# Patient Record
Sex: Female | Born: 1990 | Hispanic: No | Marital: Single | Smoking: Never smoker
Health system: Southern US, Community
[De-identification: ages and names within clinical notes are randomized; demographics above are authoritative.]

## PROBLEM LIST (undated history)

## (undated) DIAGNOSIS — E8801 Alpha-1-antitrypsin deficiency: Secondary | ICD-10-CM

## (undated) HISTORY — PX: WISDOM TOOTH EXTRACTION: SHX21

---

## 2017-07-09 NOTE — L&D Delivery Note (Signed)
Delivery Note At 3:25 AM a viable and healthy female was delivered via Vaginal, Spontaneous (Presentation:vtx ; ROA ).  APGAR: 3, 5; 7 weight 1 lb 4.8 oz (590 g).   Placenta status: spontaneous, abnormal/clot adherent  To path , .  Cord: thin caliber avulsed with the following complications: abruption.  Cord pH: n/a  Anesthesia:  none Episiotomy: None Lacerations: None Suture Repair: n/a Est. Blood Loss (mL): 54  Mom to postpartum.  Baby to NICU.  Raejean Swinford A Raijon Lindfors 06/17/2018, 4:06 AM

## 2018-03-05 LAB — OB RESULTS CONSOLE ABO/RH

## 2018-03-06 LAB — OB RESULTS CONSOLE ABO/RH: RH Type: POSITIVE

## 2018-03-11 LAB — OB RESULTS CONSOLE ANTIBODY SCREEN: ANTIBODY SCREEN: NEGATIVE

## 2018-03-11 LAB — OB RESULTS CONSOLE HEPATITIS B SURFACE ANTIGEN: Hepatitis B Surface Ag: NEGATIVE

## 2018-03-11 LAB — OB RESULTS CONSOLE RUBELLA ANTIBODY, IGM: Rubella: IMMUNE

## 2018-03-11 LAB — OB RESULTS CONSOLE RPR: RPR: NONREACTIVE

## 2018-03-11 LAB — OB RESULTS CONSOLE HIV ANTIBODY (ROUTINE TESTING): HIV: NONREACTIVE

## 2018-03-21 LAB — OB RESULTS CONSOLE GC/CHLAMYDIA
Chlamydia: NEGATIVE
Gonorrhea: NEGATIVE

## 2018-06-13 ENCOUNTER — Other Ambulatory Visit: Payer: Self-pay

## 2018-06-13 ENCOUNTER — Inpatient Hospital Stay (HOSPITAL_BASED_OUTPATIENT_CLINIC_OR_DEPARTMENT_OTHER): Payer: BLUE CROSS/BLUE SHIELD

## 2018-06-13 ENCOUNTER — Inpatient Hospital Stay (HOSPITAL_COMMUNITY)
Admission: AD | Admit: 2018-06-13 | Discharge: 2018-06-18 | DRG: 805 | Disposition: A | Discharge status: Home / Self Care | Payer: BLUE CROSS/BLUE SHIELD | Attending: Obstetrics and Gynecology | Admitting: Obstetrics and Gynecology

## 2018-06-13 ENCOUNTER — Encounter (HOSPITAL_COMMUNITY): Payer: Self-pay | Admitting: *Deleted

## 2018-06-13 DIAGNOSIS — O468X2 Other antepartum hemorrhage, second trimester: Secondary | ICD-10-CM | POA: Diagnosis not present

## 2018-06-13 DIAGNOSIS — Z3A22 22 weeks gestation of pregnancy: Secondary | ICD-10-CM

## 2018-06-13 DIAGNOSIS — O9081 Anemia of the puerperium: Secondary | ICD-10-CM | POA: Diagnosis not present

## 2018-06-13 DIAGNOSIS — O322XX0 Maternal care for transverse and oblique lie, not applicable or unspecified: Secondary | ICD-10-CM | POA: Diagnosis present

## 2018-06-13 DIAGNOSIS — E8801 Alpha-1-antitrypsin deficiency: Secondary | ICD-10-CM | POA: Diagnosis present

## 2018-06-13 DIAGNOSIS — O99284 Endocrine, nutritional and metabolic diseases complicating childbirth: Secondary | ICD-10-CM | POA: Diagnosis present

## 2018-06-13 DIAGNOSIS — D62 Acute posthemorrhagic anemia: Secondary | ICD-10-CM | POA: Diagnosis not present

## 2018-06-13 DIAGNOSIS — O4592 Premature separation of placenta, unspecified, second trimester: Secondary | ICD-10-CM | POA: Diagnosis present

## 2018-06-13 DIAGNOSIS — Z3A23 23 weeks gestation of pregnancy: Secondary | ICD-10-CM | POA: Diagnosis not present

## 2018-06-13 DIAGNOSIS — O4692 Antepartum hemorrhage, unspecified, second trimester: Secondary | ICD-10-CM

## 2018-06-13 DIAGNOSIS — B9689 Other specified bacterial agents as the cause of diseases classified elsewhere: Secondary | ICD-10-CM | POA: Diagnosis present

## 2018-06-13 DIAGNOSIS — O4690 Antepartum hemorrhage, unspecified, unspecified trimester: Secondary | ICD-10-CM

## 2018-06-13 DIAGNOSIS — O42912 Preterm premature rupture of membranes, unspecified as to length of time between rupture and onset of labor, second trimester: Secondary | ICD-10-CM | POA: Diagnosis present

## 2018-06-13 DIAGNOSIS — O26872 Cervical shortening, second trimester: Secondary | ICD-10-CM | POA: Diagnosis present

## 2018-06-13 HISTORY — DX: Alpha-1-antitrypsin deficiency: E88.01

## 2018-06-13 LAB — URINALYSIS, ROUTINE W REFLEX MICROSCOPIC
BILIRUBIN URINE: NEGATIVE
Bacteria, UA: NONE SEEN
Glucose, UA: NEGATIVE mg/dL
Ketones, ur: NEGATIVE mg/dL
Nitrite: NEGATIVE
Protein, ur: NEGATIVE mg/dL
Specific Gravity, Urine: 1.004 — ABNORMAL LOW (ref 1.005–1.030)
pH: 7 (ref 5.0–8.0)

## 2018-06-13 LAB — TYPE AND SCREEN
ABO/RH(D): O POS
Antibody Screen: NEGATIVE

## 2018-06-13 LAB — CBC
HCT: 33.8 % — ABNORMAL LOW (ref 36.0–46.0)
Hemoglobin: 10.8 g/dL — ABNORMAL LOW (ref 12.0–15.0)
MCH: 29.4 pg (ref 26.0–34.0)
MCHC: 32 g/dL (ref 30.0–36.0)
MCV: 92.1 fL (ref 80.0–100.0)
Platelets: 246 10*3/uL (ref 150–400)
RBC: 3.67 MIL/uL — AB (ref 3.87–5.11)
RDW: 13.1 % (ref 11.5–15.5)
WBC: 11.8 10*3/uL — ABNORMAL HIGH (ref 4.0–10.5)
nRBC: 0 % (ref 0.0–0.2)

## 2018-06-13 LAB — WET PREP, GENITAL
Sperm: NONE SEEN
Trich, Wet Prep: NONE SEEN
Yeast Wet Prep HPF POC: NONE SEEN

## 2018-06-13 MED ORDER — INDOMETHACIN 25 MG PO CAPS
25.0000 mg | ORAL_CAPSULE | Freq: Four times a day (QID) | ORAL | Status: AC
  Administered 2018-06-14 – 2018-06-15 (×8): 25 mg via ORAL
  Filled 2018-06-13 (×8): qty 1

## 2018-06-13 MED ORDER — LACTATED RINGERS IV BOLUS
500.0000 mL | Freq: Once | INTRAVENOUS | Status: AC
  Administered 2018-06-13: 500 mL via INTRAVENOUS

## 2018-06-13 MED ORDER — ZOLPIDEM TARTRATE 5 MG PO TABS: 5.0000 mg | ORAL_TABLET | Freq: Every evening | ORAL | Status: DC | PRN

## 2018-06-13 MED ORDER — PROGESTERONE MICRONIZED 200 MG PO CAPS
200.0000 mg | ORAL_CAPSULE | Freq: Every day | ORAL | Status: DC
  Administered 2018-06-14 – 2018-06-15 (×3): 200 mg via VAGINAL
  Filled 2018-06-13 (×4): qty 1

## 2018-06-13 MED ORDER — INDOMETHACIN 50 MG RE SUPP
50.0000 mg | Freq: Once | RECTAL | Status: AC
  Administered 2018-06-14: 50 mg via RECTAL
  Filled 2018-06-13: qty 1

## 2018-06-13 MED ORDER — NIFEDIPINE 10 MG PO CAPS
10.0000 mg | ORAL_CAPSULE | ORAL | Status: AC | PRN
  Administered 2018-06-13 (×3): 10 mg via ORAL
  Filled 2018-06-13 (×3): qty 1

## 2018-06-13 MED ORDER — LACTATED RINGERS IV SOLN
INTRAVENOUS | Status: DC
  Administered 2018-06-13 – 2018-06-14 (×2): via INTRAVENOUS

## 2018-06-13 MED ORDER — NIFEDIPINE ER 30 MG PO TB24
30.0000 mg | ORAL_TABLET | Freq: Two times a day (BID) | ORAL | Status: DC
  Administered 2018-06-14 – 2018-06-16 (×5): 30 mg via ORAL
  Filled 2018-06-13 (×7): qty 1

## 2018-06-13 MED ORDER — PRENATAL MULTIVITAMIN CH
1.0000 | ORAL_TABLET | Freq: Every day | ORAL | Status: DC
  Administered 2018-06-14 – 2018-06-16 (×3): 1 via ORAL
  Filled 2018-06-13 (×3): qty 1

## 2018-06-13 MED ORDER — BETAMETHASONE SOD PHOS & ACET 6 (3-3) MG/ML IJ SUSP
12.0000 mg | INTRAMUSCULAR | Status: AC
  Administered 2018-06-13 – 2018-06-14 (×2): 12 mg via INTRAMUSCULAR
  Filled 2018-06-13 (×2): qty 2

## 2018-06-13 MED ORDER — CALCIUM CARBONATE ANTACID 500 MG PO CHEW: 2.0000 | CHEWABLE_TABLET | ORAL | Status: DC | PRN

## 2018-06-13 MED ORDER — METRONIDAZOLE 500 MG PO TABS
500.0000 mg | ORAL_TABLET | Freq: Two times a day (BID) | ORAL | Status: DC
  Administered 2018-06-13 – 2018-06-16 (×7): 500 mg via ORAL
  Filled 2018-06-13 (×9): qty 1

## 2018-06-13 MED ORDER — DOCUSATE SODIUM 100 MG PO CAPS
100.0000 mg | ORAL_CAPSULE | Freq: Every day | ORAL | Status: DC
  Administered 2018-06-14 – 2018-06-16 (×3): 100 mg via ORAL
  Filled 2018-06-13 (×3): qty 1

## 2018-06-13 MED ORDER — ACETAMINOPHEN 325 MG PO TABS: 650.0000 mg | ORAL_TABLET | ORAL | Status: DC | PRN

## 2018-06-13 NOTE — Plan of Care (Signed)
  Problem: Education: Goal: Knowledge of disease or condition will improve Outcome: Progressing   

## 2018-06-13 NOTE — MAU Provider Note (Signed)
Obstetric Attending MAU Note  Chief Complaint:  Vaginal Bleeding   First Provider Initiated Contact with Patient 06/13/18 1803     HPI: Brenda Wood is a 27 y.o. G1P0 at [redacted]w[redacted]d who presents to maternity admissions reporting pink spotting which started last pm. Bleeding got worse today and became bright red. Baby is moving well. Some pain in lower abdomen that radiates to lower back. Feels like when her cycle Is coming on. She denies LOF. Lower abdominal pressure noted. Reports normal u/s in the office. 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 Pregnancy is uncomplicated at [facility]. Denies issues with placenta on u/s. Denies vaginal discharge. Denies recent sexual intercourse. .  Pregnancy Course: Receives care at [facility] primary  Patient Active Problem List   Diagnosis Date Noted  . Preterm labor in second trimester 06/13/2018    Past Medical History:  Diagnosis Date  . Alpha-1-antitrypsin deficiency (HCC)     OB History  Gravida Para Term Preterm AB Living  1            SAB TAB Ectopic Multiple Live Births               # Outcome Date GA Lbr Len/2nd Weight Sex Delivery Anes PTL Lv  1 Current             Past Surgical History:  Procedure Laterality Date  . WISDOM TOOTH EXTRACTION      Family History: No family history on file.  Social History: Social History   Tobacco Use  . Smoking status: Never Smoker  . Smokeless tobacco: Never Used  Substance Use Topics  . Alcohol use: Never    Frequency: Never  . Drug use: Never    Allergies: No Known Allergies  Medications Prior to Admission  Medication Sig Dispense Refill Last Dose  . brompheniramine-pseudoephedrine-DM 30-2-10 MG/5ML syrup Take by mouth 4 (four) times daily as needed.   06/13/2018 at Unknown time  . fluticasone (FLONASE) 50 MCG/ACT nasal spray Place 1 spray into both nostrils daily.   06/12/2018 at Unknown time  . cetirizine (ZYRTEC) 10 MG tablet Take 10 mg by mouth  daily.       ROS: Pertinent findings in history of present illness.  Physical Exam  Blood pressure 121/67, pulse 86, temperature 98.4 F (36.9 C), resp. rate 18, height 5\' 3"  (1.6 m), weight 67.1 kg. CONSTITUTIONAL: Well-developed, well-nourished female in no acute distress.  HENT:  Normocephalic, atraumatic, External right and left ear normal. Oropharynx is clear and moist EYES: Conjunctivae and EOM are normal. Pupils are equal, round, and reactive to light. No scleral icterus.  NECK: Normal range of motion, supple, no masses SKIN: Skin is warm and dry. No rash noted. Not diaphoretic. No erythema. No pallor. NEUROLGIC: Alert and oriented to person, place, and time. Normal reflexes, muscle tone coordination. No cranial nerve deficit noted. PSYCHIATRIC: Normal mood and affect. Normal behavior. Normal judgment and thought content. CARDIOVASCULAR: Normal heart rate noted, regular rhythm RESPIRATORY: Effort and breath sounds normal, no problems with respiration noted ABDOMEN: Soft, nontender, nondistended, gravid appropriate for gestational age MUSCULOSKELETAL: Normal range of motion. No edema and no tenderness. 2+ distal pulses.  SPECULUM EXAM: NEFG, blood in vault, pink to dark red, cervix clean Dilation: 1 Cervical Position: Posterior Exam by:: Dr. Shawnie Pons Thick   FHT:  Positive on Doppler Contractions: q 3-4 mins   Labs: Results for orders placed or performed during the hospital encounter of 06/13/18 (from the past 24  hour(s))  Wet prep, genital     Status: Abnormal   Collection Time: 06/13/18  6:30 PM  Result Value Ref Range   Yeast Wet Prep HPF POC NONE SEEN NONE SEEN   Trich, Wet Prep NONE SEEN NONE SEEN   Clue Cells Wet Prep HPF POC PRESENT (A) NONE SEEN   WBC, Wet Prep HPF POC MANY (A) NONE SEEN   Sperm NONE SEEN   CBC on admission     Status: Abnormal   Collection Time: 06/13/18  7:15 PM  Result Value Ref Range   WBC 11.8 (H) 4.0 - 10.5 K/uL   RBC 3.67 (L) 3.87 - 5.11  MIL/uL   Hemoglobin 10.8 (L) 12.0 - 15.0 g/dL   HCT 60.4 (L) 54.0 - 98.1 %   MCV 92.1 80.0 - 100.0 fL   MCH 29.4 26.0 - 34.0 pg   MCHC 32.0 30.0 - 36.0 g/dL   RDW 19.1 47.8 - 29.5 %   Platelets 246 150 - 400 K/uL   nRBC 0.0 0.0 - 0.2 %  Urinalysis, Routine w reflex microscopic     Status: Abnormal   Collection Time: 06/13/18  8:05 PM  Result Value Ref Range   Color, Urine COLORLESS (A) YELLOW   APPearance CLEAR CLEAR   Specific Gravity, Urine 1.004 (L) 1.005 - 1.030   pH 7.0 5.0 - 8.0   Glucose, UA NEGATIVE NEGATIVE mg/dL   Hgb urine dipstick LARGE (A) NEGATIVE   Bilirubin Urine NEGATIVE NEGATIVE   Ketones, ur NEGATIVE NEGATIVE mg/dL   Protein, ur NEGATIVE NEGATIVE mg/dL   Nitrite NEGATIVE NEGATIVE   Leukocytes, UA TRACE (A) NEGATIVE   RBC / HPF 0-5 0 - 5 RBC/hpf   WBC, UA 0-5 0 - 5 WBC/hpf   Bacteria, UA NONE SEEN NONE SEEN   Squamous Epithelial / LPF 0-5 0 - 5    Imaging:  No results found.  MAU Course: I L IVF given Procardia x 3 given Sonogram, shows vtx fetus, nml fluid, no placenta previa, no sign of abruption, shortened cervix with fundal pressure 1.7 cm  Re-exam after 3 hours, shows cervix not worse and more posterior. Contractions have stopped. Still with dark red blood on examiner's finger with check.  Assessment: 1. Preterm labor in second trimester without delivery   2. Vaginal bleeding during pregnancy, antepartum   3.      Bacterial Vaginosis  Plan: Admit BMZ x 2 GBS culture NICU consult IVF Procardia XL Prometium Flagyl Consider Magnesium for CP ppx if progression of labor Discussed with Dr. Lorrin Goodell, [name], MD 06/13/2018 9:20 PM

## 2018-06-13 NOTE — MAU Note (Signed)
Pt reports she has some spotting yesterday light pink. Bleeding got darker and  Heavier a few hours ago. Reports some mild lower abd pain and back pain

## 2018-06-14 LAB — ABO/RH: ABO/RH(D): O POS

## 2018-06-14 NOTE — Progress Notes (Signed)
S: slept  Through night Denies ctx (+) FM Notes scant dark red blood  O: Blood pressure 125/80, pulse 72, temperature 98.5 F (36.9 C), temperature source Oral, resp. rate 16, height 5\' 3"  (1.6 m), weight 67.1 kg, SpO2 99 %. Lungs clear to A Cor RRR Abd gravid soft non tender Pelvic deferred Ext no edema Pad small spot of dark red( burgundy )  Tracing: (+) FHR  ctx resolved after 2nd dose of indocin  IMP: unexplained 2nd trim vaginal bleeding PMC on indocin, procardia Cervical shortening in 2nd trimester on prometrium IUP@ 23 wk. BMz  P) cont with indocin for 48 hrs. Remain inpt. Cont with procardia. FHR  q shift. Cont with toco as pt does not Perceive ctx

## 2018-06-14 NOTE — H&P (Signed)
Brenda Wood is a 27 y.o. female presenting with vaginal bleeding noted this am. Pt denies intercourse. Uncomplicated pregnancy to date. See MAU notes by Dr Shawnie Pons. Pt was found to have cervical dilation with associated shortening of cervix. sono showed cervix 1.7 cm. (+) abdominal cramping. Pt was given IVF as well as procardia x 3 OB History    Gravida  1   Para      Term      Preterm      AB      Living        SAB      TAB      Ectopic      Multiple      Live Births             Past Medical History:  Diagnosis Date  . Alpha-1-antitrypsin deficiency [facility])    Past Surgical History:  Procedure Laterality Date  . WISDOM TOOTH EXTRACTION     Family History: family history is not on file. Social History:  reports that she has never smoked. She has never used smokeless tobacco. She reports that she does not drink alcohol or use drugs.     Maternal Diabetes: No Genetic Screening: Normal Maternal Ultrasounds/Referrals: Normal Fetal Ultrasounds or other Referrals:  None Maternal Substance Abuse:  No Significant Maternal Medications:  None Significant Maternal Lab Results:  None Other Comments:  None  Review of Systems  Gastrointestinal: Positive for abdominal pain.  Genitourinary: Negative for dysuria.   History Dilation: 1 Exam by:: Dr. Shawnie Pons Blood pressure 121/67, pulse 86, temperature 98.4 F (36.9 C), resp. rate 18, height 5\' 3"  (1.6 m), weight 67.1 kg. Exam Physical Exam  Constitutional: She is oriented to person, place, and time. She appears well-developed and well-nourished.  Eyes: EOM are normal.  Neck: Neck supple.  Cardiovascular: Regular rhythm.  Respiratory: Breath sounds normal.  GI: Soft.  Musculoskeletal: She exhibits no edema.  Neurological: She is alert and oriented to person, place, and time.  Skin: Skin is warm and dry.    Prenatal labs: ABO, Rh: --/--/O POS (12/06 1915) Antibody: NEG (12/06 1915) Rubella:  Immune2 RPR:    NR HBsAg:   neg HIV:   neg GBS:   not done  Assessment/Plan: 2nd trimester vaginal bleeding Cervical shortening in pregnancy Alpha 1 antitrypsin deficiency IUP @ 22 6/7 weeks BV P) admit. Will start indocin x 48 hrs. Prometrium. NICU consult cont toco. IVF. Flagyl for BV. FHR  monitoring  Belladonna Lubinski A Charlsie Fleeger 06/14/2018, 12:01 AM

## 2018-06-14 NOTE — Consult Note (Signed)
Neonatology Consult Note:  At the request of the patients obstetrician Dr. Ronita Hipps I met with Sande Rives and FOB.  She is 23 weeks currently with pregnancy complicated by 2nd trimester vaginal bleeding, cervical shortening, Alpha 1 antitrypsin deficiency.  She is receiving indocin, prometrium, IVF, Flagyl for BV and undergoing FHR  monitoring.  We discussed morbidity/mortality at this gestional age, delivery room resuscitation, including intubation and surfactant in DR.  Discussed mechanical ventilation and risk for chronic lung disease, risk for IVH with potential for motor / cognitive deficits, ROP, NEC, sepsis, as well as temperature instability and feeding immaturity.  Discussed NG / OG feeds, benefits of MBM in reducing incidence of NEC.   Discussed likely length of stay.  Thank you for allowing [place] to participate in her care.  Please call with questions.  Higinio Roger, DO  Neonatologist  The total length of face-to-face or floor / unit time for this encounter was 30 minutes.  Counseling and / or coordination of care was greater than fifty percent of the time.

## 2018-06-15 DIAGNOSIS — O4692 Antepartum hemorrhage, unspecified, second trimester: Secondary | ICD-10-CM | POA: Diagnosis present

## 2018-06-15 NOTE — Progress Notes (Signed)
S: notes scant blood with wiping  (+) FM Denies ctx  O: BP 111/68 (BP Location: Right Arm)   Pulse 83   Temp 98.6 F (37 C) (Oral)   Resp 18   Ht 5\' 3"  (1.6 m)   Wt 67.1 kg   SpO2 97%   BMI 26.22 kg/m  Lungs clear to A  Cor RRR Abd gravid nontender Pelvic sl flared ext os/firm short 2cm posterior Extr. No edema or calf tenderness  Tracing (+) FHR No ctx  IMP: PMC on procardia XL , indocin Cervical shortening in pregnancy IUP@ 23 1/7  Wk BMZ given 2nd trimester vaginal bleeding P) complete indocin. Watch sx off indocin. Disc future mgmt per primary OB Dr Billy Coast. If remains stable , outpt mgmt

## 2018-06-15 NOTE — Discharge Instructions (Signed)

## 2018-06-16 ENCOUNTER — Encounter (HOSPITAL_COMMUNITY): Payer: Self-pay

## 2018-06-16 ENCOUNTER — Inpatient Hospital Stay (HOSPITAL_BASED_OUTPATIENT_CLINIC_OR_DEPARTMENT_OTHER): Payer: BLUE CROSS/BLUE SHIELD

## 2018-06-16 DIAGNOSIS — O4692 Antepartum hemorrhage, unspecified, second trimester: Secondary | ICD-10-CM

## 2018-06-16 DIAGNOSIS — Z3A23 23 weeks gestation of pregnancy: Secondary | ICD-10-CM

## 2018-06-16 LAB — CULTURE, BETA STREP (GROUP B ONLY)

## 2018-06-16 LAB — CBC
HCT: 30.9 % — ABNORMAL LOW (ref 36.0–46.0)
Hemoglobin: 10 g/dL — ABNORMAL LOW (ref 12.0–15.0)
MCH: 29.9 pg (ref 26.0–34.0)
MCHC: 32.4 g/dL (ref 30.0–36.0)
MCV: 92.2 fL (ref 80.0–100.0)
Platelets: 208 10*3/uL (ref 150–400)
RBC: 3.35 MIL/uL — ABNORMAL LOW (ref 3.87–5.11)
RDW: 13.7 % (ref 11.5–15.5)
WBC: 12.4 10*3/uL — ABNORMAL HIGH (ref 4.0–10.5)
nRBC: 0.3 % — ABNORMAL HIGH (ref 0.0–0.2)

## 2018-06-16 LAB — GC/CHLAMYDIA PROBE AMP (~~LOC~~) NOT AT ARMC
Chlamydia: NEGATIVE
Neisseria Gonorrhea: NEGATIVE

## 2018-06-16 LAB — MAGNESIUM: Magnesium: 3.9 mg/dL — ABNORMAL HIGH (ref 1.7–2.4)

## 2018-06-16 MED ORDER — MAGNESIUM SULFATE BOLUS VIA INFUSION
4.0000 g | Freq: Once | INTRAVENOUS | Status: AC
  Administered 2018-06-16: 4 g via INTRAVENOUS
  Filled 2018-06-16: qty 500

## 2018-06-16 MED ORDER — OXYTOCIN 10 UNIT/ML IJ SOLN: 10.0000 [IU] | Freq: Once | INTRAMUSCULAR | Status: DC

## 2018-06-16 MED ORDER — OXYCODONE-ACETAMINOPHEN 5-325 MG PO TABS: 1.0000 | ORAL_TABLET | ORAL | Status: DC | PRN

## 2018-06-16 MED ORDER — SODIUM CHLORIDE 0.9 % IV SOLN
2.0000 g | Freq: Four times a day (QID) | INTRAVENOUS | Status: DC
  Administered 2018-06-16 – 2018-06-17 (×2): 2 g via INTRAVENOUS
  Filled 2018-06-16: qty 2
  Filled 2018-06-16 (×2): qty 2000
  Filled 2018-06-16: qty 2

## 2018-06-16 MED ORDER — MAGNESIUM SULFATE 40 G IN LACTATED RINGERS - SIMPLE
2.0000 g/h | INTRAVENOUS | Status: DC
  Filled 2018-06-16: qty 500

## 2018-06-16 MED ORDER — LIDOCAINE HCL (PF) 1 % IJ SOLN
30.0000 mL | INTRAMUSCULAR | Status: DC | PRN
  Filled 2018-06-16: qty 30

## 2018-06-16 MED ORDER — LACTATED RINGERS IV SOLN
INTRAVENOUS | Status: DC
  Administered 2018-06-16: 18:00:00 via INTRAVENOUS

## 2018-06-16 MED ORDER — FLUTICASONE PROPIONATE 50 MCG/ACT NA SUSP
2.0000 | Freq: Every day | NASAL | Status: DC
  Administered 2018-06-16: 2 via NASAL
  Filled 2018-06-16: qty 16

## 2018-06-16 MED ORDER — SOD CITRATE-CITRIC ACID 500-334 MG/5ML PO SOLN: 30.0000 mL | ORAL | Status: DC | PRN

## 2018-06-16 MED ORDER — LACTATED RINGERS IV SOLN: INTRAVENOUS | Status: DC

## 2018-06-16 MED ORDER — OXYTOCIN BOLUS FROM INFUSION
500.0000 mL | Freq: Once | INTRAVENOUS | Status: AC
  Administered 2018-06-17: 500 mL via INTRAVENOUS

## 2018-06-16 MED ORDER — SODIUM CHLORIDE 0.9 % IV SOLN
500.0000 mg | INTRAVENOUS | Status: DC
  Administered 2018-06-16: 500 mg via INTRAVENOUS
  Filled 2018-06-16: qty 500

## 2018-06-16 MED ORDER — MAGNESIUM SULFATE 2 GM/50ML IV SOLN: 2.0000 g | Freq: Once | INTRAVENOUS | Status: DC

## 2018-06-16 MED ORDER — ACETAMINOPHEN 325 MG PO TABS: 650.0000 mg | ORAL_TABLET | ORAL | Status: DC | PRN

## 2018-06-16 MED ORDER — MAGNESIUM SULFATE BOLUS VIA INFUSION
2.0000 g | Freq: Once | INTRAVENOUS | Status: AC
  Administered 2018-06-16: 2 g via INTRAVENOUS
  Filled 2018-06-16: qty 500

## 2018-06-16 MED ORDER — TERBUTALINE SULFATE 1 MG/ML IJ SOLN
0.2500 mg | Freq: Once | INTRAMUSCULAR | Status: AC
  Administered 2018-06-16: 0.25 mg via SUBCUTANEOUS
  Filled 2018-06-16: qty 1

## 2018-06-16 MED ORDER — ONDANSETRON HCL 4 MG/2ML IJ SOLN: 4.0000 mg | Freq: Four times a day (QID) | INTRAMUSCULAR | Status: DC | PRN

## 2018-06-16 MED ORDER — OXYCODONE-ACETAMINOPHEN 5-325 MG PO TABS: 2.0000 | ORAL_TABLET | ORAL | Status: DC | PRN

## 2018-06-16 MED ORDER — OXYTOCIN 40 UNITS IN LACTATED RINGERS INFUSION - SIMPLE MED
2.5000 [IU]/h | INTRAVENOUS | Status: DC
  Filled 2018-06-16: qty 1000

## 2018-06-16 MED ORDER — LACTATED RINGERS IV SOLN: 500.0000 mL | INTRAVENOUS | Status: DC | PRN

## 2018-06-16 NOTE — Consult Note (Signed)
[facility]  [facility] 06/16/2018    7:30 PM  Neonatal Medicine Consultation         Mariama Saintvil          MRN:  [id number]  I was called at the request of the patient's obstetrician (Dr. Cherly Hensen) to speak to this patient due to potential premature delivery as early as 23 2/7 weeks.  She was seen on 12/7 by Dr. Algernon Huxley from neonatal medicine, but tonight has had her cervix change to complete dilatation.  Consequently she was moved from the 3rd floor Women's unit to L&D.  She has been started on magnesium sulfate and antibiotics.  Her OB plans to keep her under close observation, and delay the delivery as long as possible.  Refer to Dr. Mauricio Po note, as he reviewed all the features expected with a baby born this young.  I reviewed with the patient the expected survival and severe to profound CNS disease for 23-24 week newborns (as Dr. Algernon Huxley also did).  Given the poor survival and high morbidity, I advised the parents they are not obligated to request critical newborn care, and that sometimes parents elect to spend the time after birth just holding their newborn without medical intervention.  Of if they want [place] to provide all the support available to prolong their baby's life, we are prepared to do so.  However, if we do the latter and the newborn later shows evidence that our treatment has become futile and that the baby is dying, we would recommend the critical care be discontinued, not to prolong the baby's suffering.  For now, I reminded them that I am prepared to do everything possible to try and preserve this baby's life.  I spent 30 minutes reviewing the record, speaking to the patient, and entering appropriate documentation.  More than 50% of the time was spent face to face with patient.   _____________________ Electronically Signed By: Angelita Ingles, MD Neonatologist

## 2018-06-16 NOTE — Progress Notes (Signed)
Patient transferred to [facility] suites rm [id number] escorted by RN.

## 2018-06-16 NOTE — Progress Notes (Addendum)
HD#4 23.2 weeks   S: notes scant blood with wiping, slight dark red, mostly brown. No contraxns  (+) FM  O: BP (!) 112/59 (BP Location: Right Arm)   Pulse 92   Temp 98.2 F (36.8 C) (Oral)   Resp 18   Ht 5\' 3"  (1.6 m)   Wt 67.1 kg   SpO2 98%   BMI 26.22 kg/m  Abd gravid non tender, soft  Pelvic deferred repeat exam Extr. No edema or calf tenderness  Tracing (+) FHR 140s  No ctx  IMP: 27 yo G1 23.2 wks, threatened preterm labor with irritability, short cervix.  S/p BTMZ 12/6, 12/7 S/p Indocin 48 hrs, stopped last evening. No contractions and no new bleeding.  Continue Procardia PO, vaginal Prometrium  BV- Flagyl bid  Repeat CL today and if stable, discharge home and f/up with Dr Billy Coast in 1 week with repeat CL in office.  Pelvic rest, Modified bedrest, OOQ until further discussion, if cervix stability noted. Inform work.   Hilary Hertz, MD

## 2018-06-16 NOTE — Progress Notes (Signed)
Pt arrived at L&D SROM sl mec fluid per RN Digital exam revealed 4/60/-3 Tracing: baseline 140  (+) ctx q 4-5 mins  IMP": PPROM after prob hourglass cervical exam BV IUP@ 23 2/7 weeks P) start PPROM ( Amp/Azithromycin) antibiotics.  Magnesium Sulfate continue.

## 2018-06-16 NOTE — Progress Notes (Signed)
S:  Pt c/o nasal stuffiness Notes low back pain and some abdominal cramping  O: BP 120/73   Pulse 89   Temp 98.2 F (36.8 C) (Oral)   Resp 16   Ht 5\' 3"  (1.6 m)   Wt 67.1 kg   SpO2 98%   BMI 26.22 kg/m   VE deferred  Tracing: baseline 145-150 (+) variables with ctx Ctx q 2-5 mins CBC Latest Ref Rng & Units 06/16/2018 06/13/2018  WBC 4.0 - 10.5 K/uL 12.4(H) 11.8(H)  Hemoglobin 12.0 - 15.0 g/dL 10.0(L) 10.8(L)  Hematocrit 36.0 - 46.0 % 30.9(L) 33.8(L)  Platelets 150 - 400 K/uL 208 246   G/C neg GBS cx neg  [place] Mfm Ob Transvaginal  Result Date: 06/16/2018 ----------------------------------------------------------------------  OBSTETRICS REPORT                       (Signed Final 06/16/2018 06:46 pm) ---------------------------------------------------------------------- Patient Info  ID #:       [id number]                          D.O.B.:  March 09, 1991 (27 yrs)  Name:       Brenda Wood                   Visit Date: 06/16/2018 04:48 pm ---------------------------------------------------------------------- Performed By  Performed By:     Lenise Arena        Ref. Address:     [address]                                                             [place], [place]                                                             [address]  Attending:        Blase Mess MD       Secondary Phy.:   MAU Nursing-                                                             MAU/Triage  Referred By:      Reva Bores          Location:         [facility]  MD ---------------------------------------------------------------------- Orders   #  Description                          Code         Ordered By   1  [place] MFM OB TRANSVAGINAL               [phone]      VAISHALI MODY  ----------------------------------------------------------------------   #  Order #                    Accession #                  Episode #   1  [id number]                  [id number]                  [id number]  ---------------------------------------------------------------------- Indications   [redacted] weeks gestation of pregnancy                Z3A.23   Vaginal bleeding in pregnancy, second          O46.92   trimester  ---------------------------------------------------------------------- Vital Signs  Weight (lb): 148                               Height:        5'3"  BMI:         26.21 ---------------------------------------------------------------------- Fetal Evaluation  Num Of Fetuses:         1  Fetal Heart Rate(bpm):  151  Cardiac Activity:       Observed  Presentation:           Transverse, head to maternal left  Placenta:               Anterior  Amniotic Fluid  AFI FV:      Within normal limits                              Largest Pocket(cm)                              4.77 ---------------------------------------------------------------------- OB History  Gravidity:    1 ---------------------------------------------------------------------- Gestational Age  Clinical EDD:  23w 2d                                        EDD:   10/11/18  Best:          23w 2d     Det. By:  Clinical EDD             EDD:   10/11/18 ---------------------------------------------------------------------- Cervix Uterus Adnexa  Cervix  Length:              0  cm.  Funnel Width:        4  cm.  Appears dilated, see comments.  Comment  Non-measurable cervix and amniotic "sludge"  observed. ---------------------------------------------------------------------- Impression  Live mid-trimester gestation with cervical funeling and non-  measurable cervix. ---------------------------------------------------------------------- Recommendations  Continue admission until delivery due to elevated risk for  preterm delivery. ----------------------------------------------------------------------  Blase Mess, MD Electronically Signed Final Report   06/16/2018 06:46  pm ----------------------------------------------------------------------  [place] Mfm Ob Transvaginal  Result Date: 06/13/2018 ----------------------------------------------------------------------  OBSTETRICS REPORT                       (Signed Final 06/13/2018 10:51 pm) ---------------------------------------------------------------------- Patient Info  ID #:       [id number]                          D.O.B.:  [birth year] (27 yrs)  Name:       Brenda Wood                   Visit Date: 06/13/2018 07:41 pm ---------------------------------------------------------------------- Performed By  Performed By:     Ellin Saba        Ref. Address:      [address]                                                              [place], [place]                                                              [address]  Attending:        Lin Landsman      Secondary Phy.:    MAU Nursing-                    MD                                                              MAU/Triage  Referred By:      Reva Bores          Location:          [facility]                    MD ---------------------------------------------------------------------- Orders   #  Description                          Code         Ordered By   1  [place] MFM OB LIMITED                    32440.10     Kenney Houseman  PRATT   2  [place] MFM OB TRANSVAGINAL               [id number]      TANYA PRATT  ----------------------------------------------------------------------   #  Order #                    Accession #                 Episode #   1  [id number]                  [id number]                  [id number]   2  [id number]                  [id number]                  [id number]  ---------------------------------------------------------------------- Indications   Vaginal bleeding in pregnancy, second          O46.92   trimester   [redacted] weeks gestation of pregnancy                Z3A.22   ---------------------------------------------------------------------- Fetal Evaluation  Num Of Fetuses:          1  Fetal Heart Rate(bpm):   151  Cardiac Activity:        Observed  Presentation:            Cephalic  Placenta:                Anterior  Amniotic Fluid  AFI FV:      Within normal limits                              Largest Pocket(cm)                              5.11  Comment:    No placental abruption or previa identified. ---------------------------------------------------------------------- OB History  Gravidity:    1 ---------------------------------------------------------------------- Gestational Age  Clinical EDD:  22w 6d                                        EDD:   10/11/18  Best:          22w 6d     Det. By:  Clinical EDD             EDD:   10/11/18 ---------------------------------------------------------------------- Cervix Uterus Adnexa  Cervix  Length:            1.8  cm.  Measured transvaginally.  Left Ovary  Not visualized.  Right Ovary  Within normal limits. ---------------------------------------------------------------------- Impression  Cervix is shortened at 1.8 cm  No evidence other source of bleeding such as placent previa  or abruption. ---------------------------------------------------------------------- Recommendations  Followup CL in 1 week ----------------------------------------------------------------------               Lin Landsman, MD Electronically Signed Final Report   06/13/2018 10:51 pm ----------------------------------------------------------------------  [place] Mfm Ob Limited  Result Date: 06/13/2018 ----------------------------------------------------------------------  OBSTETRICS REPORT                       (Signed Final 06/13/2018 10:51 pm) ---------------------------------------------------------------------- Patient Info  ID #:       [id number]                          D.O.B.:  [birth year] (27 yrs)  Name:       Brenda Wood                   Visit Date:  06/13/2018 07:41 pm ---------------------------------------------------------------------- Performed By  Performed By:     Ellin Saba        Ref. Address:      [address]                                                              [place], [place]                                                              [address]  Attending:        Lin Landsman      Secondary Phy.:    MAU Nursing-                    MD                                                              MAU/Triage  Referred By:      Reva Bores          Location:          [facility]                    MD ---------------------------------------------------------------------- Orders   #  Description                          Code         Ordered By   1  [place] MFM OB LIMITED                    76815.01     TANYA PRATT   2  [place] MFM OB TRANSVAGINAL               19147.8      TANYA PRATT  ----------------------------------------------------------------------   #  Order #  Accession #                 Episode #   1  [id number]                  [id number]                  [id number]   2  [id number]                  [id number]                  [id number]  ---------------------------------------------------------------------- Indications   Vaginal bleeding in pregnancy, second          O46.92   trimester   [redacted] weeks gestation of pregnancy                Z3A.22  ---------------------------------------------------------------------- Fetal Evaluation  Num Of Fetuses:          1  Fetal Heart Rate(bpm):   151  Cardiac Activity:        Observed  Presentation:            Cephalic  Placenta:                Anterior  Amniotic Fluid  AFI FV:      Within normal limits                              Largest Pocket(cm)                              5.11  Comment:    No placental abruption or previa identified.  ---------------------------------------------------------------------- OB History  Gravidity:    1 ---------------------------------------------------------------------- Gestational Age  Clinical EDD:  22w 6d                                        EDD:   10/11/18  Best:          22w 6d     Det. By:  Clinical EDD             EDD:   10/11/18 ---------------------------------------------------------------------- Cervix Uterus Adnexa  Cervix  Length:            1.8  cm.  Measured transvaginally.  Left Ovary  Not visualized.  Right Ovary  Within normal limits. ---------------------------------------------------------------------- Impression  Cervix is shortened at 1.8 cm  No evidence other source of bleeding such as placent previa  or abruption. ---------------------------------------------------------------------- Recommendations  Followup CL in 1 week ----------------------------------------------------------------------               Lin Landsman, MD Electronically Signed Final Report   06/13/2018 10:51 pm ---------------------------------------------------------------------- IMP" PPROM on magnesium for CP prophylaxis, PTL PTL IUP@ 23 2/7 weeks. BMZ complete P) amp/azithromycin started. [place] terb x 1 dose given. Cont magnesium sulfate. Pt informed of mgmt

## 2018-06-16 NOTE — Progress Notes (Signed)
Dr Cherly Hensen is on her way to eval patient.  She returned from U/S and her cervix is un measurable.  Magnesium was started. Patient also is having a small amount of bleeding.

## 2018-06-16 NOTE — Progress Notes (Addendum)
Called by MFM regarding sonogram: no measurable cervix Called pt who reports increased vaginal bleeding and cramping which started just prior to going for sonogram. Pt reports some Cramping on and off today Magnesium IV started Tracing> (+) FHR No ctx noted  SSE:  Bulging membrane noted  Digital revealed no palp cervix  foley cath placed and pt transferred to L&D

## 2018-06-17 ENCOUNTER — Encounter (HOSPITAL_COMMUNITY): Payer: Self-pay

## 2018-06-17 MED ORDER — BENZOCAINE-MENTHOL 20-0.5 % EX AERO
1.0000 "application " | INHALATION_SPRAY | CUTANEOUS | Status: DC | PRN
  Administered 2018-06-17: 1 via TOPICAL
  Filled 2018-06-17: qty 56

## 2018-06-17 MED ORDER — DIPHENHYDRAMINE HCL 25 MG PO CAPS: 25.0000 mg | ORAL_CAPSULE | Freq: Four times a day (QID) | ORAL | Status: DC | PRN

## 2018-06-17 MED ORDER — ACETAMINOPHEN 325 MG PO TABS: 650.0000 mg | ORAL_TABLET | ORAL | Status: DC | PRN

## 2018-06-17 MED ORDER — ZOLPIDEM TARTRATE 5 MG PO TABS: 5.0000 mg | ORAL_TABLET | Freq: Every evening | ORAL | Status: DC | PRN

## 2018-06-17 MED ORDER — COCONUT OIL OIL
1.0000 "application " | TOPICAL_OIL | Status: DC | PRN
  Administered 2018-06-17: 1 via TOPICAL
  Filled 2018-06-17: qty 120

## 2018-06-17 MED ORDER — FERROUS SULFATE 325 (65 FE) MG PO TABS
325.0000 mg | ORAL_TABLET | Freq: Two times a day (BID) | ORAL | Status: DC
  Administered 2018-06-17 – 2018-06-18 (×3): 325 mg via ORAL
  Filled 2018-06-17 (×3): qty 1

## 2018-06-17 MED ORDER — DIBUCAINE 1 % RE OINT: 1.0000 "application " | TOPICAL_OINTMENT | RECTAL | Status: DC | PRN

## 2018-06-17 MED ORDER — PRENATAL MULTIVITAMIN CH
1.0000 | ORAL_TABLET | Freq: Every day | ORAL | Status: DC
  Administered 2018-06-17 – 2018-06-18 (×2): 1 via ORAL
  Filled 2018-06-17 (×2): qty 1

## 2018-06-17 MED ORDER — BUTORPHANOL TARTRATE 1 MG/ML IJ SOLN
INTRAMUSCULAR | Status: AC
  Filled 2018-06-17: qty 2

## 2018-06-17 MED ORDER — TETANUS-DIPHTH-ACELL PERTUSSIS 5-2.5-18.5 LF-MCG/0.5 IM SUSP
0.5000 mL | Freq: Once | INTRAMUSCULAR | Status: AC
  Administered 2018-06-18: 0.5 mL via INTRAMUSCULAR
  Filled 2018-06-17: qty 0.5

## 2018-06-17 MED ORDER — FLUTICASONE PROPIONATE 50 MCG/ACT NA SUSP
1.0000 | Freq: Every day | NASAL | Status: DC
  Administered 2018-06-18: 1 via NASAL
  Filled 2018-06-17: qty 16

## 2018-06-17 MED ORDER — ONDANSETRON HCL 4 MG PO TABS: 4.0000 mg | ORAL_TABLET | ORAL | Status: DC | PRN

## 2018-06-17 MED ORDER — BUTORPHANOL TARTRATE 1 MG/ML IJ SOLN
2.0000 mg | Freq: Once | INTRAMUSCULAR | Status: AC
  Administered 2018-06-17: 2 mg via INTRAVENOUS

## 2018-06-17 MED ORDER — SIMETHICONE 80 MG PO CHEW: 80.0000 mg | CHEWABLE_TABLET | ORAL | Status: DC | PRN

## 2018-06-17 MED ORDER — SENNOSIDES-DOCUSATE SODIUM 8.6-50 MG PO TABS
2.0000 | ORAL_TABLET | ORAL | Status: DC
  Administered 2018-06-18: 2 via ORAL
  Filled 2018-06-17: qty 2

## 2018-06-17 MED ORDER — IBUPROFEN 600 MG PO TABS
600.0000 mg | ORAL_TABLET | Freq: Four times a day (QID) | ORAL | Status: DC
  Administered 2018-06-17 – 2018-06-18 (×6): 600 mg via ORAL
  Filled 2018-06-17 (×6): qty 1

## 2018-06-17 MED ORDER — WITCH HAZEL-GLYCERIN EX PADS: 1.0000 "application " | MEDICATED_PAD | CUTANEOUS | Status: DC | PRN

## 2018-06-17 MED ORDER — ONDANSETRON HCL 4 MG/2ML IJ SOLN: 4.0000 mg | INTRAMUSCULAR | Status: DC | PRN

## 2018-06-17 NOTE — Progress Notes (Signed)
PPD #0 SVD, PTD at 23+3 weeks, baby girl "Savannah" in NICU Interval note  S:  Reports feeling fine, minimal discomfort             Tolerating po/ No nausea or vomiting / Denies SOB/ Reports some slight dizziness when she just got up. States she is eating and drinking regularly. Thinks it may be related to medications she received in delivery.              Bleeding is moderate             Pain controlled with Motrin             Up ad lib / ambulatory / voiding QS  Newborn in NICU, states baby is doing okay; planning to walk back to NICU for more updates.  She is pumping colostrum O:               VS: BP 112/63 (BP Location: Right Arm)   Pulse 78   Temp 98.2 F (36.8 C) (Oral)   Resp 18   Ht 5\' 3"  (1.6 m)   Wt 67.1 kg   SpO2 100%   Breastfeeding? Unknown   BMI 26.22 kg/m    LABS:              Recent Labs    06/16/18 1858  WBC 12.4*  HGB 10.0*  PLT 208               Blood type: --/--/O POS, O POS Performed at [facility], [address] [place], [place] [address]  [phone])  Rubella: Immune (09/03 0000)                     I&O: Intake/Output      12/09 0701 - 12/10 0700 12/10 0701 - 12/11 0700   P.O. 660    I.V. (mL/kg) 1220.4 (18.2)    IV Piggyback 450    Total Intake(mL/kg) 2330.4 (34.7)    Urine (mL/kg/hr) 1075 (0.7)    Blood 54    Total Output 1129    Net +1201.4         Urine Occurrence 1 x                  Physical Exam:             Alert and oriented X3  Exam deferred as many visitors were in her room    A: PPD # 0 , SVD  S/p Preterm delivery   Chronic Anemia - on oral FE BID  Doing well - stable status  P: Routine post partum orders  Encouraged to increase hydration, eat frequent meals, rest  Recommend wheelchair to NICU if persistent dizziness   See lactation; continue pumping  Continue current care  Carlean Jews, MSN, CNM [facility]

## 2018-06-17 NOTE — Lactation Note (Signed)
This note was copied from a baby's chart. Lactation Consultation Note  Patient Name: Brenda Wood [id number] Date: 06/17/2018 Reason for consult: Initial assessment;NICU baby;Preterm <34wks;[place] Baby delivered at 23.3 days.  Mom has initiated pumping and is obtaining small amounts of colostrum.  Instructed to pump and hand express 8-12 times/24 hours.  Mom has a DEBP at home.  No questions or concerns at this time.  Discussed milk coming to volume.  Encouraged to call for assist prn.  Lactation consultation services and support information given.  Mom has Providing Breastmilk For Your Baby in NICU book at bedside.  Maternal Data    Feeding    LATCH Score                   Interventions    Lactation Tools Discussed/Used Initiated by:: RN Date initiated:: 06/17/18   Consult Status Consult Status: Follow-up    Huston Foley 06/17/2018, 11:19 AM

## 2018-06-17 NOTE — Progress Notes (Signed)
S: awake Notes some rectal pressure Complete at 2:20 am  O: Magnesium 2g/hr IV amp/azithromycin VS T98.5 ( ax) BP 112/58  VE fully (+2) station. Internally pt feels very warm  tracing: computer system down Baseline ~150 (+) audible decel to 100's with increase uterine pressure numbers  IMP: unstoppable PTL PPROM IUP @ 23 3/7 wk P) stop magnesium. Proceed with delivery. NICU called for attendance

## 2018-06-17 NOTE — Progress Notes (Signed)
S.  Cramps   O: VS  98.2 BP 117/50 Magnesium Amp/Azitrhomycin IV  VE deferred Reviewed sono report from MFM  transverse lie Bedside sono done: confirm VTX presentation Active fetus  Amniotic fluid noted  Tracing: baseline 150 (+) variables Ctx q 3-4 mins  IMP: PPROM on IV amp/azithromycin PTL on Magnesium for  CP neuro prophylaxis and PTL IUP @ 23 2/7 weeks P) check mag level. Cont with present mgmt

## 2018-06-18 LAB — CBC
HCT: 31.7 % — ABNORMAL LOW (ref 36.0–46.0)
Hemoglobin: 9.9 g/dL — ABNORMAL LOW (ref 12.0–15.0)
MCH: 28.9 pg (ref 26.0–34.0)
MCHC: 31.2 g/dL (ref 30.0–36.0)
MCV: 92.7 fL (ref 80.0–100.0)
Platelets: 227 10*3/uL (ref 150–400)
RBC: 3.42 MIL/uL — ABNORMAL LOW (ref 3.87–5.11)
RDW: 13.8 % (ref 11.5–15.5)
WBC: 9.5 10*3/uL (ref 4.0–10.5)
nRBC: 0.4 % — ABNORMAL HIGH (ref 0.0–0.2)

## 2018-06-18 MED ORDER — FERROUS SULFATE 325 (65 FE) MG PO TABS: 325.0000 mg | ORAL_TABLET | Freq: Two times a day (BID) | ORAL | 3 refills | Status: DC

## 2018-06-18 MED ORDER — IBUPROFEN 600 MG PO TABS: 600.0000 mg | ORAL_TABLET | Freq: Four times a day (QID) | ORAL | 0 refills | Status: DC

## 2018-06-18 NOTE — Lactation Note (Signed)
This note was copied from a baby's chart. Lactation Consultation Note  Patient Name: Brenda Wood [id number] Date: 06/18/2018 Reason for consult: Follow-up assessment;NICU baby   Baby 32 hours old.  Baby in NICU.  < 2 lbs.  [redacted]w[redacted]d. Reviewed hands on pumping and mother is getting a flow of colostrum. Mother has a new pump from her sister. Discussed pumping rooms, labels, milk transportation. Encouraged mother to pump at least q 3 hours to total at least 8 times per day. Discussed engorgement care and cleaning.   Maternal Data    Feeding    LATCH Score                   Interventions Interventions: Hand express;DEBP  Lactation Tools Discussed/Used     Consult Status Consult Status: Complete Date: 06/19/18    Dahlia Byes [facility] 06/18/2018, 12:02 PM

## 2018-06-18 NOTE — Progress Notes (Signed)
PPD #1 SVD, intact perineum, PTD at 23+3 weeks, baby girl "Savannah" in NICU  S:  Reports feeling okay, physically feeling fine, but emotionally having difficulty coping; states she feels like it all happened so quickly she has not had time to process.  Appropriately tearful. Reports good support from FOB, family, and friends.  Concerned about finances since she will have a prolonged stay in NICU. Desires early discharge home today to help with processing and coping being away from her daughter.              Tolerating po/ No nausea or vomiting / Denies dizziness or SOB - no dizziness today, moving slow             Bleeding is moderate             Pain controlled with Motrin             Up ad lib / ambulatory / voiding QS  Newborn stable in NICU - mom pumping colostrum  O:               VS: BP 125/60 (BP Location: Right Arm)   Pulse 68   Temp 98.8 F (37.1 C)   Resp 18   Ht 5\' 3"  (1.6 m)   Wt 67.1 kg   SpO2 100%   Breastfeeding? Unknown   BMI 26.22 kg/m    LABS:              Recent Labs    06/16/18 1858 06/18/18 0542  WBC 12.4* 9.5  HGB 10.0* 9.9*  PLT 208 227               Blood type: --/--/O POS, O POS Performed at [facility], [address] [place], [place] [address]  [phone])  Rubella: Immune (09/03 0000)                     I&O: Intake/Output      12/10 0701 - 12/11 0700 12/11 0701 - 12/12 0700   P.O.     I.V. (mL/kg)     IV Piggyback     Total Intake(mL/kg)     Urine (mL/kg/hr)     Blood     Total Output     Net                        Physical Exam:             Alert and oriented X3  Lungs: Clear and unlabored  Heart: regular rate and rhythm / no murmurs  Abdomen: soft, non-tender, non-distended              Fundus: firm, non-tender, U-3  Perineum: intact, no edema, no ecchymosis, no erythema   Lochia: none noted on pad (recently changed)  Extremities: no edema, no calf pain or tenderness, TED hose in place    A: PPD # 1 , SVD  S/p Preterm delivery              ABL Anemia compounding Chronic Anemia - on oral FE BID             Doing well - stable status  P: Routine post partum orders  Discharge home today  [facility] discharge book given, instructions and warning s/s reviewed   Dr. Billy Coast to see patient prior to discharge   CSW to see patient prior to discharge   F/u with Dr. Billy Coast  in 2 weeks - at risk for PPD, then at 6 weeks   Carlean Jews, MSN, CNM [facility]

## 2018-06-18 NOTE — Discharge Summary (Signed)
Obstetric Discharge Summary   Patient Name: Brenda Wood DOB: 05-26-1991 MRN: [id number]  Date of Admission: 06/13/2018 Date of Discharge: 06/18/2018 Date of Delivery: 06/17/18 Gestational Age at Delivery: [redacted]w[redacted]d  Primary OB: [facility] - Dr. Billy Coast  Antepartum complications:  - 2nd trimester vaginal bleeding - Alpha 1 antitrypsin deficiency  - Bacterial vaginosis  - Cervical shortening in pregnancy  Prenatal Labs:  ABO, Rh: --/--/O POS (12/06 1915) Antibody: NEG (12/06 1915) Rubella:  Immune2 RPR:   NR HBsAg:   neg HIV:   neg GBS:   not done Admitting Diagnosis: 2nd trimester vaginal bleeding, Cervical shortening in pregnancy  Secondary Diagnoses: Patient Active Problem List   Diagnosis Date Noted  . Postpartum care following vaginal delivery (12/10) 06/17/2018  . SVD (spontaneous vaginal delivery) 06/17/2018  . Preterm labor in second trimester without delivery 06/16/2018  . Vaginal bleeding in pregnancy, second trimester 06/15/2018  . Preterm labor in second trimester 06/13/2018    Augmentation: None  Date of Delivery: 06/17/18 Delivered By: Dr. Cherly Hensen Delivery Type: spontaneous vaginal delivery Anesthesia: none Placenta: sponatneous Laceration: none Episiotomy: none  Newborn Data: Live born female  Birth Weight: 1 lb 4.8 oz (590 g) APGAR: 3, 5  Newborn Delivery   Birth date/time:  06/17/2018 03:25:00 Delivery type:  Vaginal, Spontaneous        Hospital/Postpartum Course  **(Vaginal Delivery): Pt. Admitted on 06/13/18 with vaginal bleeding in 2nd trimester and cervical shortening. She stayed inpatient with BMZ x 2, Indocin, Procardia, Prometrium, and abx. She developed worsening bleeding and cramping on 06/16/18 and MFM sono showed no measurable cervix. Magnesium sulfate IV given for CP prophylaxis. She was transferred to L&D with cervical change and hourglass membranes. She progressed with PPROM, and abx given. She delivered by SVD on 06/17/18. See  all notes and delivery summary for further details. Patient's postpartum course complicated by preterm delivery and baby in NICU.  By time of discharge on PPD#1, her pain was controlled on oral pain medications; she had appropriate lochia and was ambulating, voiding without difficulty and tolerating regular diet.  She was deemed stable for discharge to home.     Labs: CBC Latest Ref Rng & Units 06/18/2018 06/16/2018 06/13/2018  WBC 4.0 - 10.5 K/uL 9.5 12.4(H) 11.8(H)  Hemoglobin 12.0 - 15.0 g/dL 4.6(N) 10.0(L) 10.8(L)  Hematocrit 36.0 - 46.0 % 31.7(L) 30.9(L) 33.8(L)  Platelets 150 - 400 K/uL 227 208 246   Conflict (See Lab Report): O POS/O POS Performed at [facility], [address] [place], [place] [address]   Physical exam:  BP 117/63 (BP Location: Right Arm)   Pulse 73   Temp 99.1 F (37.3 C) (Oral)   Resp 18   Ht 5\' 3"  (1.6 m)   Wt 67.1 kg   SpO2 98%   Breastfeeding? Unknown   BMI 26.22 kg/m  General: alert and no distress Pulm: normal respiratory effort Lochia: appropriate Abdomen: soft, NT Uterine Fundus: firm, below umbilicus Perineum: healing well, no significant erythema, no significant edema Extremities: No evidence of DVT seen on physical exam. No lower extremity edema.   Disposition: stable, discharge to home Baby Disposition: NICU  Contraception: discussed, but undecided   Rh Immune globulin given: N/A Rubella vaccine given: N/A Tdap vaccine given in AP or PP setting: offered prior to discharge Flu vaccine given in AP or PP setting: offered prior to discharge    Plan:  Osiris Charles was discharged to home in good condition. [facility] at [facility] in 2 weeks.  Discharge Instructions: Per After Visit Summary. Refer to After Visit Summary and [facility] OB[facility] discharge booklet  Activity: Advance as tolerated. Pelvic rest for 6 weeks.   Diet: Regular, Heart Healthy Discharge Medications: Allergies as of 06/18/2018   No Known  Allergies     Medication List    STOP taking these medications   guaiFENesin-dextromethorphan 100-10 MG/5ML syrup Commonly known as:  ROBITUSSIN DM     TAKE these medications   cetirizine 10 MG tablet Commonly known as:  ZYRTEC Take 10 mg by mouth daily as needed for allergies.   ferrous sulfate 325 (65 FE) MG tablet Take 1 tablet (325 mg total) by mouth 2 (two) times daily with a meal.   fluticasone 50 MCG/ACT nasal spray Commonly known as:  FLONASE Place 1 spray into both nostrils daily.   ibuprofen 600 MG tablet Commonly known as:  ADVIL,MOTRIN Take 1 tablet (600 mg total) by mouth every 6 (six) hours.   prenatal multivitamin Tabs tablet Take 1 tablet by mouth daily at 12 noon.      Outpatient follow up:  Follow-up Information    Olivia Mackie, MD. Schedule an appointment as soon as possible for a visit in 2 week(s).   Specialty:  Obstetrics and Gynecology Why:  Postpartum visit, then 6 week visit  Contact information: [address] [place] [address] [phone]           Signed:  Carlean Jews, MSN, CNM [facility]

## 2018-06-19 NOTE — Clinical Social Work Maternal (Signed)
CLINICAL SOCIAL WORK MATERNAL/CHILD NOTE  Patient Details  Name: Brenda Wood MRN: [id number] Date of Birth: 16-Jul-1990  Date:  06/19/2018  Clinical Social Worker Initiating Note:  Laurey Arrow Date/Time: Initiated:  06/18/18/1527     Child's Name:  Brenda Wood   Biological Parents:  Mother, Father(FOB is Brenda Wood 05/05/1988 and  he resides at  [address] [place] [address].)   Need for Interpreter:  None   Reason for Referral:  Parental Support of Premature Babies < 32 weeks/or Critically Ill babies   Address:  [address]. [place] [address]    Phone number:  [phone] (home)     Additional phone number:   Household Members/Support Persons (HM/SP):   (Per MOB, MOB resides with MOB mother and siblings. )   HM/SP Name Relationship DOB or Age  HM/SP -1        HM/SP -2        HM/SP -3        HM/SP -4        HM/SP -5        HM/SP -6        HM/SP -7        HM/SP -8          Natural Supports (not living in the home):  Extended Family, Immediate Family, Parent, Spouse/significant other   Professional Supports: None   Employment: Full-time   Type of Work: Firefighter   Education:  Nurse, adult   Homebound arranged:    Museum/gallery curator Resources:  Therapist, art provided family with information to apply for SSI and Medicaid.)   Other Resources:      Cultural/Religious Considerations Which May Impact Care:  None Reported  Strengths:  Ability to meet basic needs , Home prepared for child    Psychotropic Medications:         Pediatrician:       Pediatrician List:   [place]      Pediatrician Fax Number:    Risk Factors/Current Problems:      Cognitive State:  Able to Concentrate , Alert , Linear Thinking , Goal Oriented , Insightful    Mood/Affect:  Tearful , Comfortable , Relaxed ,  Calm    CSW Assessment: CSW met MOB and FOB in room 313 to complete an assesement for NICU admission and Edinburgh score of 12. When CSW arrived MOB was resting in bed and FOB was on his phone. CSW explained CSW's role and MOB gave CSW permission to complete the assessment while FOB was present. The couple demonstrated support of each other and was receptive to meeting with CSW. MOB demonstrated appropriate emotions related the prematurity of infant (tearful) and excitement about being a new mother (happy).  CSW reviewed MOB's Edinburgh results and assisted MOB with processing her thoughts and feelings about her birhting experience and NICU admission.  MOB and FOB shared feelings of being scared and fear the unknown.  CSW validated and normalized their feelings and provided education regarding PMADS. CSW provided MOB with resources for mental health follow up.  CSW encouraged MOB to evaluate her mental health throughout the postpartum period with the use of the New Mom Checklist developed by [employer] as well as the [place] Postnatal Depression Scale and notify a medical professional if symptoms arise. CSW assessed for safety and MOB denied  SI and HI. The couple reported having a great support team and feels confident that they will be able to obtain all necessary items for infant prior to infant's discharge.  CSW provided the family with information regarding infant's eligilbity for SSI benefits and the family agreed to think about if they want to pursue.    CSW will continue to offer the family resources and supports while infant remains in the NICU.   CSW Plan/Description:  Psychosocial Support and Ongoing Assessment of Needs, Perinatal Mood and Anxiety Disorder (PMADs) Education, Theatre stage manager Income (SSI) Information, Other Patient/Family Education, Other Information/Referral to [place], MSW, [employer] Clinical Social Work [phone]  Dimple Nanas, LCSW 06/19/2018, 11:44 AM

## 2018-06-20 LAB — ANTIPHOSPHOLIPID SYNDROME EVAL, BLD
Anticardiolipin IgG: <9 GPL U/mL (ref 0–14)
Anticardiolipin IgM: <9 MPL U/mL (ref 0–12)
DRVVT: 38.4 s (ref 0.0–47.0)
PTT Lupus Anticoagulant: 29.7 s (ref 0.0–51.9)
Phosphatydalserine, IgA: 2 APS IgA (ref 0–20)
Phosphatydalserine, IgG: 1 GPS IgG (ref 0–11)
Phosphatydalserine, IgM: 28 MPS IgM — ABNORMAL HIGH (ref 0–25)

## 2019-06-17 LAB — OB RESULTS CONSOLE HEPATITIS B SURFACE ANTIGEN: Hepatitis B Surface Ag: NEGATIVE

## 2019-06-17 LAB — OB RESULTS CONSOLE RUBELLA ANTIBODY, IGM: Rubella: IMMUNE

## 2019-06-17 LAB — OB RESULTS CONSOLE GC/CHLAMYDIA
Chlamydia: NEGATIVE
Gonorrhea: NEGATIVE

## 2019-06-17 LAB — OB RESULTS CONSOLE ABO/RH: RH Type: POSITIVE

## 2019-06-17 LAB — OB RESULTS CONSOLE ANTIBODY SCREEN: Antibody Screen: NEGATIVE

## 2019-06-17 LAB — OB RESULTS CONSOLE RPR: RPR: NONREACTIVE

## 2019-06-17 LAB — OB RESULTS CONSOLE HIV ANTIBODY (ROUTINE TESTING): HIV: NONREACTIVE

## 2019-06-30 ENCOUNTER — Other Ambulatory Visit: Payer: Self-pay | Admitting: Obstetrics and Gynecology

## 2019-07-06 ENCOUNTER — Encounter (HOSPITAL_COMMUNITY): Payer: Self-pay | Admitting: *Deleted

## 2019-07-06 ENCOUNTER — Telehealth (HOSPITAL_COMMUNITY): Payer: Self-pay | Admitting: *Deleted

## 2019-07-06 NOTE — Telephone Encounter (Signed)
Preadmission screen  

## 2019-07-08 LAB — OB RESULTS CONSOLE GBS
GBS: NEGATIVE
GBS: NEGATIVE

## 2019-07-10 NOTE — L&D Delivery Note (Signed)
Operative Delivery Note At 7:06 PM a viable and healthy female was delivered via Vaginal, Vacuum Investment banker, operational).  Presentation: vertex; Position: Left,, Occiput,, Anterior; Station: +4.  Verbal consent: obtained from patient.  Risks and benefits discussed in detail.  Risks include, but are not limited to the risks of anesthesia, bleeding, infection, damage to maternal tissues, fetal cephalhematoma.  There is also the risk of inability to effect vaginal delivery of the head, or shoulder dystocia that cannot be resolved by established maneuvers, leading to the need for emergency cesarean section.  APGAR: 6, 9; weight 9 lb 4 oz (4195 g).   Placenta status: spontaneous, intact.   Cord:  with the following complications: .  Cord pH: na  Anesthesia:  epidural Instruments: kiwi x 3 pulls, one pop off Episiotomy: Median with third degree extension(3C) Lacerations: Cervica(bilateral)l;3rd degree Suture Repair: 2.0 3.0 vicryl rapide for episiotomy. 0 vicryl for anal sphincter repair and for cervical laceration repair Est. Blood Loss (mL): 386  Mom to postpartum.  Baby to Couplet care / Skin to Skin.  Kathya Wilz J 01/13/2020, 8:58 PM

## 2019-07-14 ENCOUNTER — Other Ambulatory Visit (HOSPITAL_COMMUNITY)
Admission: RE | Admit: 2019-07-14 | Discharge: 2019-07-14 | Disposition: A | Discharge status: Home / Self Care | Payer: BC Managed Care – PPO | Source: Ambulatory Visit | Attending: Obstetrics and Gynecology | Admitting: Obstetrics and Gynecology

## 2019-07-14 DIAGNOSIS — Z01812 Encounter for preprocedural laboratory examination: Secondary | ICD-10-CM | POA: Diagnosis present

## 2019-07-14 DIAGNOSIS — Z20822 Contact with and (suspected) exposure to covid-19: Secondary | ICD-10-CM | POA: Insufficient documentation

## 2019-07-14 LAB — SARS CORONAVIRUS 2 (TAT 6-24 HRS): SARS Coronavirus 2: NEGATIVE

## 2019-07-15 NOTE — Anesthesia Preprocedure Evaluation (Addendum)
Anesthesia Evaluation  Patient identified by MRN, date of birth, ID band Patient awake    Reviewed: Allergy & Precautions, NPO status , Patient's Chart, lab work & pertinent test results  Airway Mallampati: II  TM Distance: >3 FB Neck ROM: Full    Dental no notable dental hx. (+) Teeth Intact   Pulmonary neg pulmonary ROS,    Pulmonary exam normal breath sounds clear to auscultation       Cardiovascular Exercise Tolerance: Good negative cardio ROS Normal cardiovascular exam Rhythm:Regular Rate:Normal     Neuro/Psych negative neurological ROS     GI/Hepatic negative GI ROS, Hx of Alpa 1 antitrypsin deficiency   Endo/Other  negative endocrine ROS  Renal/GU negative Renal ROS     Musculoskeletal   Abdominal   Peds negative pediatric ROS (+)  Hematology   Anesthesia Other Findings   Reproductive/Obstetrics (+) Pregnancy                            Anesthesia Physical Anesthesia Plan  ASA: II  Anesthesia Plan: Spinal   Post-op Pain Management:    Induction:   PONV Risk Score and Plan: Treatment may vary due to age or medical condition  Airway Management Planned: Nasal Cannula and Natural Airway  Additional Equipment: None  Intra-op Plan:   Post-operative Plan:   Informed Consent: I have reviewed the patients History and Physical, chart, labs and discussed the procedure including the risks, benefits and alternatives for the proposed anesthesia with the patient or authorized representative who has indicated his/her understanding and acceptance.     Dental advisory given  Plan Discussed with:   Anesthesia Plan Comments: (13.1 wk G2p1 for Spinall for cerclage)       Anesthesia Quick Evaluation

## 2019-07-16 ENCOUNTER — Encounter (HOSPITAL_COMMUNITY): Payer: Self-pay | Admitting: *Deleted

## 2019-07-16 ENCOUNTER — Encounter (HOSPITAL_COMMUNITY)
Admission: EM | Disposition: A | Discharge status: Home / Self Care | Payer: Self-pay | Source: Home / Self Care | Attending: Obstetrics and Gynecology

## 2019-07-16 ENCOUNTER — Ambulatory Visit (HOSPITAL_COMMUNITY)
Admission: RE | Admit: 2019-07-16 | Payer: BC Managed Care – PPO | Source: Home / Self Care | Admitting: Obstetrics and Gynecology

## 2019-07-16 ENCOUNTER — Inpatient Hospital Stay (HOSPITAL_COMMUNITY): Payer: BC Managed Care – PPO | Admitting: Anesthesiology

## 2019-07-16 ENCOUNTER — Ambulatory Visit (HOSPITAL_COMMUNITY)
Admission: EM | Admit: 2019-07-16 | Discharge: 2019-07-16 | Disposition: A | Discharge status: Home / Self Care | Payer: BC Managed Care – PPO | Attending: Obstetrics and Gynecology | Admitting: Obstetrics and Gynecology

## 2019-07-16 ENCOUNTER — Other Ambulatory Visit: Payer: Self-pay

## 2019-07-16 DIAGNOSIS — N840 Polyp of corpus uteri: Secondary | ICD-10-CM | POA: Diagnosis not present

## 2019-07-16 DIAGNOSIS — O3431 Maternal care for cervical incompetence, first trimester: Secondary | ICD-10-CM | POA: Diagnosis present

## 2019-07-16 DIAGNOSIS — Z3A13 13 weeks gestation of pregnancy: Secondary | ICD-10-CM | POA: Diagnosis not present

## 2019-07-16 DIAGNOSIS — O99891 Other specified diseases and conditions complicating pregnancy: Secondary | ICD-10-CM | POA: Insufficient documentation

## 2019-07-16 HISTORY — PX: CERVICAL CERCLAGE: SHX1329

## 2019-07-16 LAB — CBC
HCT: 35 % — ABNORMAL LOW (ref 36.0–46.0)
Hemoglobin: 11.4 g/dL — ABNORMAL LOW (ref 12.0–15.0)
MCH: 29.4 pg (ref 26.0–34.0)
MCHC: 32.6 g/dL (ref 30.0–36.0)
MCV: 90.2 fL (ref 80.0–100.0)
Platelets: 247 10*3/uL (ref 150–400)
RBC: 3.88 MIL/uL (ref 3.87–5.11)
RDW: 12.8 % (ref 11.5–15.5)
WBC: 6.7 10*3/uL (ref 4.0–10.5)
nRBC: 0 % (ref 0.0–0.2)

## 2019-07-16 LAB — TYPE AND SCREEN
ABO/RH(D): O POS
Antibody Screen: NEGATIVE

## 2019-07-16 LAB — ABO/RH: ABO/RH(D): O POS

## 2019-07-16 SURGERY — CERCLAGE, CERVIX, VAGINAL APPROACH
Anesthesia: Spinal

## 2019-07-16 MED ORDER — FENTANYL CITRATE (PF) 100 MCG/2ML IJ SOLN: 25.0000 ug | INTRAMUSCULAR | Status: DC | PRN

## 2019-07-16 MED ORDER — BUPIVACAINE IN DEXTROSE 0.75-8.25 % IT SOLN
INTRATHECAL | Status: DC | PRN
  Administered 2019-07-16: 1 mL via INTRATHECAL

## 2019-07-16 MED ORDER — CEFAZOLIN SODIUM-DEXTROSE 2-4 GM/100ML-% IV SOLN: 2.0000 g | INTRAVENOUS | Status: DC

## 2019-07-16 MED ORDER — LACTATED RINGERS IV SOLN: INTRAVENOUS | Status: DC

## 2019-07-16 MED ORDER — ONDANSETRON HCL 4 MG/2ML IJ SOLN: 4.0000 mg | Freq: Once | INTRAMUSCULAR | Status: DC | PRN

## 2019-07-16 MED ORDER — ACETAMINOPHEN 10 MG/ML IV SOLN
1000.0000 mg | Freq: Once | INTRAVENOUS | Status: DC | PRN
  Administered 2019-07-16: 09:00:00 1000 mg via INTRAVENOUS

## 2019-07-16 MED ORDER — ACETAMINOPHEN 10 MG/ML IV SOLN
INTRAVENOUS | Status: AC
  Filled 2019-07-16: qty 100

## 2019-07-16 SURGICAL SUPPLY — 18 items
CANISTER SUCT 3000ML PPV (MISCELLANEOUS) ×3 IMPLANT
GLOVE BIO SURGEON STRL SZ7.5 (GLOVE) ×3 IMPLANT
GLOVE BIOGEL PI IND STRL 7.0 (GLOVE) ×1 IMPLANT
GLOVE BIOGEL PI INDICATOR 7.0 (GLOVE) ×2
GOWN STRL REUS W/TWL LRG LVL3 (GOWN DISPOSABLE) ×9 IMPLANT
NEEDLE MAYO CATGUT SZ4 (NEEDLE) ×9 IMPLANT
NS IRRIG 1000ML POUR BTL (IV SOLUTION) ×3 IMPLANT
PACK VAGINAL MINOR WOMEN LF (CUSTOM PROCEDURE TRAY) ×3 IMPLANT
PAD OB MATERNITY 4.3X12.25 (PERSONAL CARE ITEMS) ×3 IMPLANT
PAD PREP 24X48 CUFFED NSTRL (MISCELLANEOUS) ×3 IMPLANT
SUT ETHIBOND  5 (SUTURE) ×2
SUT ETHIBOND 5 (SUTURE) ×1 IMPLANT
SUT PROLENE 0 CT 1 30 (SUTURE) ×3 IMPLANT
TOWEL OR 17X24 6PK STRL BLUE (TOWEL DISPOSABLE) ×6 IMPLANT
TRAY FOLEY W/BAG SLVR 14FR (SET/KITS/TRAYS/PACK) ×3 IMPLANT
TUBING NON-CON 1/4 X 20 CONN (TUBING) IMPLANT
TUBING NON-CON 1/4 X 20' CONN (TUBING)
YANKAUER SUCT BULB TIP NO VENT (SUCTIONS) IMPLANT

## 2019-07-16 NOTE — H&P (Signed)
Brenda Wood is an 29 y.o. female. History of Cervical Insufficiency for McDonald Cerclage.  Pertinent Gynecological History: Menses: flow is moderate Bleeding: na Contraception: none DES exposure: denies Blood transfusions: none Sexually transmitted diseases: no past history Previous GYN Procedures: DNC  Last mammogram: na Date: na Last pap: normal Date: 2020 OB History: G2, P0   Menstrual History: Menarche age: 60 No LMP recorded. Patient is pregnant.    Past Medical History:  Diagnosis Date  . Alpha-1-antitrypsin deficiency [facility])     Past Surgical History:  Procedure Laterality Date  . WISDOM TOOTH EXTRACTION      Family History  Problem Relation Age of Onset  . Hypertension Father   . COPD Father   . Alpha-1 antitrypsin deficiency Father   . Diabetes Maternal Grandfather   . Diabetes Paternal Grandmother     Social History:  reports that she has never smoked. She has never used smokeless tobacco. She reports that she does not drink alcohol or use drugs.  Allergies: No Known Allergies  No medications prior to admission.    Review of Systems  Constitutional: Negative.   All other systems reviewed and are negative.   unknown if currently breastfeeding. Physical Exam  Nursing note and vitals reviewed. Constitutional: She is oriented to person, place, and time. She appears well-developed and well-nourished.  HENT:  Head: Normocephalic and atraumatic.  Cardiovascular: Normal rate and regular rhythm.  Respiratory: Effort normal and breath sounds normal.  GI: Soft. Bowel sounds are normal.  Genitourinary:    Vagina and uterus normal.   Musculoskeletal:        General: Normal range of motion.     Cervical back: Normal range of motion and neck supple.  Neurological: She is alert and oriented to person, place, and time. She has normal reflexes.  Skin: Skin is warm and dry.  Psychiatric: She has a normal mood and affect.    No results found for this or  any previous visit (from the past 24 hour(s)).  No results found.  NL NIPS GBS neg Neg Wet prep  Assessment/Plan: 13 WK IUP History of PTD at 23 wks - ? PTL vs Cervical Insufficiency Risks vs benefits of cerclage discussed. Risks of SROM and SAB discussed. Risks of infection, bleeding and injury to surrounding organs discussed. Consent done Brenda Wood J 07/16/2019, 6:42 AM

## 2019-07-16 NOTE — Discharge Instructions (Signed)
Cervical Cerclage, Care After This sheet gives you information about how to care for yourself after your procedure. Your health care provider may also give you more specific instructions. If you have problems or questions, contact your health care provider. What can I expect after the procedure? After your procedure, it is common to have:  Cramping in your abdomen.  Mucus discharge from your vagina. This may last for several days.  Painful urination (dysuria).  Spotting, or small drops of blood coming from your vagina. Follow these instructions at home:  Medicines  Take over-the-counter and prescription medicines only as told by your health care provider.  Ask your health care provider if the medicine prescribed to you requires you to avoid driving or using heavy machinery. General instructions  If you are told to go on bed rest, follow instructions from your health care provider. You may need to be on bed rest for up to 3 days.  Keep track of your vaginal discharge and watch for any changes. If you notice changes, tell your health care provider.  Avoid physical activities and exercise until your health care provider approves. Ask your health care provider what activities are safe for you.  Do not douche or have sex until your health care provider says it is okay to do so.  Keep all follow-up visits, including prenatal visits, as told by your health care provider. This is important. ? Prenatal visits are all the care that you receive before the birth of your baby. ? You may also need an ultrasound.  You may be asked to have weekly visits to have your cervix checked. Contact a health care provider if you:  Have abnormal discharge from your vagina, such as clots.  Have a bad-smelling discharge from your vagina.  Develop a rash on your skin. This may look like redness and swelling.  Become light-headed or feel like you are going to faint.  Have abdominal pain that does not  get better with medicine.  Have nausea or vomiting that does not go away. Get help right away if you:  Have vaginal bleeding that is heavier or more frequent than spotting.  Are leaking fluid or your water breaks.  Have a fever or chills.  Faint.  Have uterine contractions. These may feel like: ? A back ache. ? Lower abdominal pain. ? Mild cramps, similar to menstrual cramps. ? Tightening or pressure in your abdomen.  Think that your baby is not moving as much as usual, or you cannot feel your baby move.  Have chest pain.  Have shortness of breath. Summary  After the procedure, it is common to have cramping, vaginal discharge, painful urination, and small drops of blood coming from your vagina.  If you are told to go on bed rest, follow instructions from your health care provider. You may need to be on bed rest for up to 3 days.  Keep track of your vaginal discharge and watch for any changes. If you notice changes, tell your health care provider.  Contact a health care provider if you have abnormal vaginal discharge, become light-headed, or have pain that cannot be controlled with medicines.  Get help right away if you have heavy vaginal bleeding, your water breaks, or you have uterine contractions. Also, get help right away if your baby is not moving as much as usual, or you have chest pain or shortness of breath. This information is not intended to replace advice given to you by your health care provider.   Make sure you discuss any questions you have with your health care provider. Document Revised: 04/21/2019 Document Reviewed: 02/18/2019 Elsevier Patient Education  2020 Elsevier Inc.  

## 2019-07-16 NOTE — OR Nursing (Signed)
Pt VSS stable. Able to walk around room. Foley catheter removed at 1205. Instructed pt she has 6hrs to void upon removal. Discharge instructions given

## 2019-07-16 NOTE — Op Note (Signed)
[name], Brenda Wood MEDICAL RECORD ZO:[id number] ACCOUNT [id number] DATE OF BIRTH:Jul 17, 1990 FACILITY: [facility] LOCATION: [facility] PHYSICIAN:Davin Archuletta J. Shields Pautz, MD  OPERATIVE REPORT  DATE OF PROCEDURE:  07/16/2019  PREOPERATIVE DIAGNOSES:  Cervical insufficiency, 13-week intrauterine pregnancy, history of preterm birth.  POSTOPERATIVE DIAGNOSES:  Cervical insufficiency, 13-week intrauterine pregnancy, history of preterm birth, endocervical polyp.  PROCEDURE:  McDonald cervical transvaginal cerclage and removal of endocervical polyp.  SURGEON:  Olivia Mackie, MD  ASSISTANT:  Brien Mates, RNFA  ANESTHESIA:  Spinal.  ESTIMATED BLOOD LOSS:  Less than 10 mL.  COMPLICATIONS:  None.  DRAINS:  Foley.  COUNTS:  Correct.  DISPOSITION:  The patient was taken to recovery in good condition.  SPECIMENS:  Cervical polyp fragment to pathology.  BRIEF OPERATIVE NOTE:  After being apprised of the risks of anesthesia, infection, bleeding, and surrounding organs, possible need for repair, delayed versus immediate complications to include bladder injury with possible need for repair, possible risk  of spontaneous rupture of membranes and subsequent pregnancy loss, the patient was brought to the operating room where she was administered spinal anesthetic without complications.  Prepped and draped in usual sterile fashion.  Foley catheter placed.   Feet are placed in Yellofin stirrups.  Trendelenburg position was established.  Vaginal exam reveals a closed cervix and a large endocervical polyp protruding from the external os.  At this time, the polyp is cauterized at the base and removed.  Good  hemostasis was noted.  Specimen is sent to pathology.  A 5 Ethibond suture is then placed in a free needle and a transvaginal cerclage was placed at the level of internal os.  With the use of retractor and manipulation was placed at 1 o'clock to 10  o'clock, 10 o'clock to 7 o'clock, 6 o'clock to 4  o'clock, and 4 o'clock to 1 o'clock, and tied over a Prolene suture without complication.  Minimal bleeding noted.  Good hemostasis was noted.  The patient tolerated the procedure well, was transferred to  recovery in good condition.  CN/NUANCE  D:07/16/2019 T:07/16/2019 JOB:[id number]

## 2019-07-16 NOTE — Anesthesia Procedure Notes (Signed)
Spinal  Patient location during procedure: OB Start time: 07/16/2019 8:18 AM End time: 07/16/2019 8:28 AM Staffing Performed: anesthesiologist  Anesthesiologist: Trevor Iha, MD Preanesthetic Checklist Completed: patient identified, IV checked, risks and benefits discussed, surgical consent, monitors and equipment checked, pre-op evaluation and timeout performed Spinal Block Patient position: sitting Prep: DuraPrep and site prepped and draped Patient monitoring: heart rate, cardiac monitor, continuous pulse ox and blood pressure Approach: midline Location: L3-4 Injection technique: single-shot Needle Needle type: Pencan  Needle gauge: 24 G Needle length: 10 cm Needle insertion depth: 7 cm Assessment Sensory level: T10 Additional Notes 2 Attempt (s). Pt tolerated procedure well.

## 2019-07-16 NOTE — Op Note (Signed)
07/16/2019  9:05 AM  PATIENT:  Brenda Wood  29 y.o. female  PRE-OPERATIVE DIAGNOSIS:  Cervical Insufficiency History of PTD  POST-OPERATIVE DIAGNOSIS:  Same Endocervical polyp  PROCEDURE:  Procedure(s): McDonald CERCLAGE CERVICAL Removal of Endocervical polyp  SURGEON:  Surgeon(s): Olivia Mackie, MD  ASSISTANTSHerbert Seta, RNFA   ANESTHESIA:   spinal  ESTIMATED BLOOD LOSS: 5 mL   DRAINS: Urinary Catheter (Foley)   LOCAL MEDICATIONS USED:  NONE  SPECIMEN:  Source of Specimen:  polyp fragment  DISPOSITION OF SPECIMEN:  PATHOLOGY  COUNTS:  YES  DICTATION #: [id number]  PLAN OF CARE: dc home  PATIENT DISPOSITION:  PACU - hemodynamically stable.

## 2019-07-16 NOTE — Transfer of Care (Signed)
Immediate Anesthesia Transfer of Care Note  Patient: Brenda Wood  Procedure(s) Performed: McDonald CERCLAGE CERVICAL (N/A )  Patient Location: PACU  Anesthesia Type:Spinal  Level of Consciousness: awake, alert  and oriented  Airway & Oxygen Therapy: Patient Spontanous Breathing  Post-op Assessment: Report given to RN and Post -op Vital signs reviewed and stable  Post vital signs: Reviewed and stable  Last Vitals:  Vitals Value Taken Time  BP 118/44 07/16/19 0903  Temp    Pulse 70 07/16/19 0904  Resp 19 07/16/19 0904  SpO2 100 % 07/16/19 0904  Vitals shown include unvalidated device data.  Last Pain: There were no vitals filed for this visit.       Complications: No apparent anesthesia complications

## 2019-07-16 NOTE — Anesthesia Postprocedure Evaluation (Signed)
Anesthesia Post Note  Patient: [name]  Procedure(s) Performed: McDonald CERCLAGE CERVICAL (N/A )     Patient location during evaluation: PACU Anesthesia Type: Spinal Level of consciousness: oriented and awake and alert Pain management: pain level controlled Vital Signs Assessment: post-procedure vital signs reviewed and stable Respiratory status: spontaneous breathing and respiratory function stable Cardiovascular status: blood pressure returned to baseline and stable Postop Assessment: no headache, no backache, no apparent nausea or vomiting and able to ambulate Anesthetic complications: no    Last Vitals:  Vitals:   07/16/19 0943 07/16/19 1000  BP: 120/69 116/71  Pulse: 73 78  Resp:  18  Temp:    SpO2: 100% 100%    Last Pain:  Vitals:   07/16/19 1000  PainSc: 1    Pain Goal:                Epidural/Spinal Function Cutaneous sensation: Able to Discern Pressure (07/16/19 1000), Patient able to flex knees: Yes (07/16/19 1000), Patient able to lift hips off bed: Yes (07/16/19 1000), Back pain beyond tenderness at insertion site: No (07/16/19 1000), Progressively worsening motor and/or sensory loss: No (07/16/19 1000), Bowel and/or bladder incontinence post epidural: No (07/16/19 1000)  Brenda Wood

## 2019-07-17 LAB — SURGICAL PATHOLOGY

## 2019-11-25 IMAGING — US US MFM OB TRANSVAGINAL
1 series · 15 of 21 positions shown · non-contrast
Comparison: none

[Series 1: us mfm ob transvaginal · 21 acquisitions, 15 frames shown]
[im 1/21]
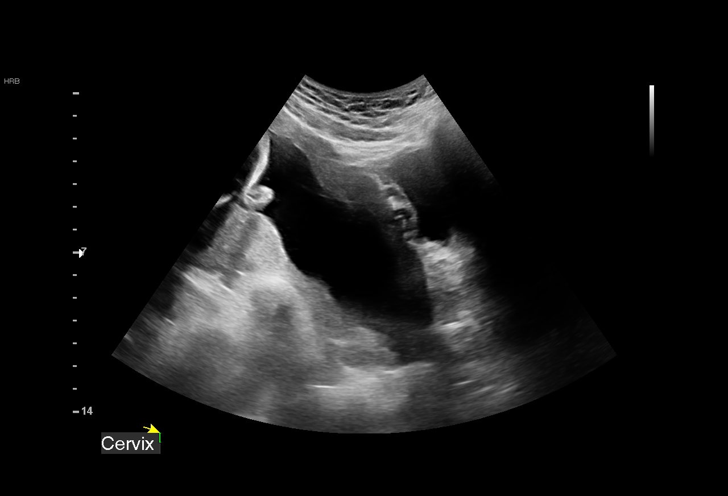
[im 3/21]
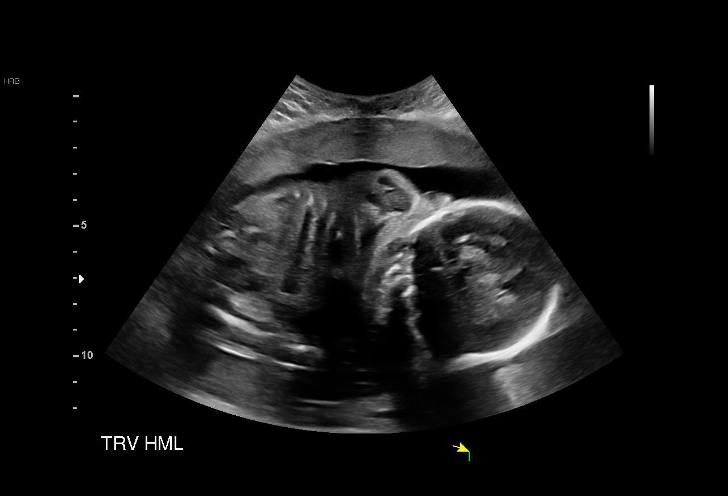
[im 4/21]
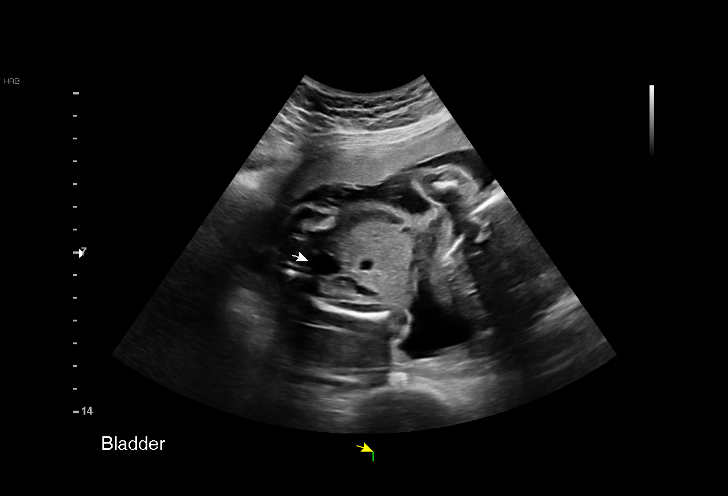
[im 5/21]
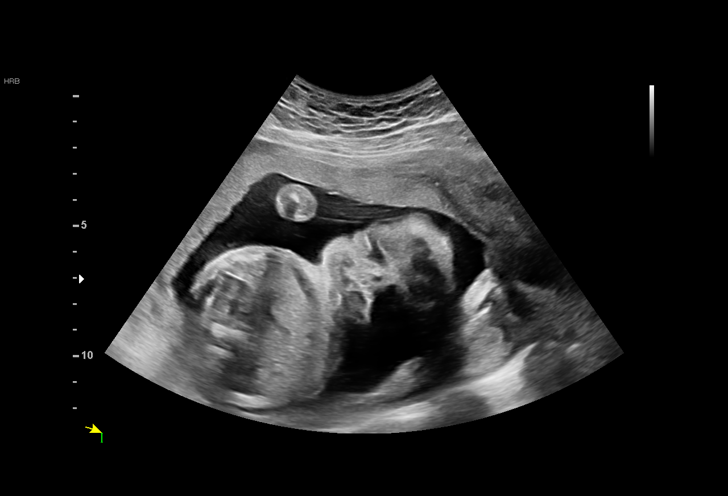
[im 7/21]
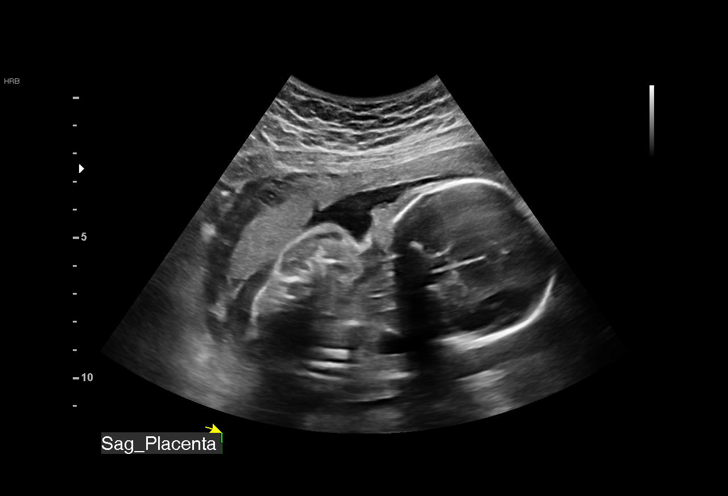
[im 8/21]
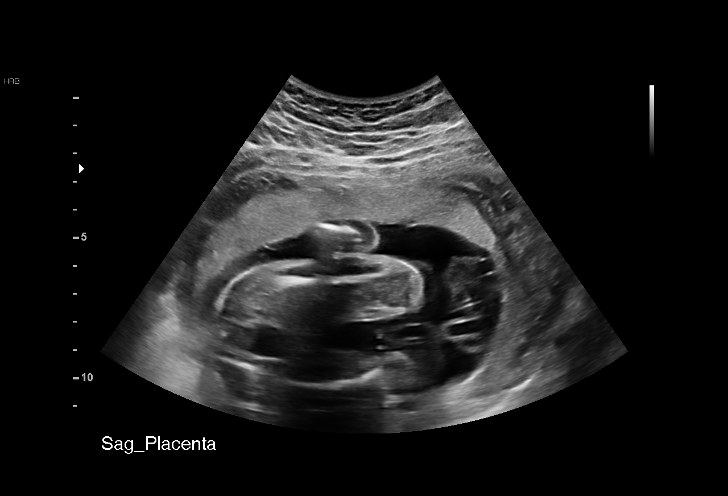
[im 10/21]
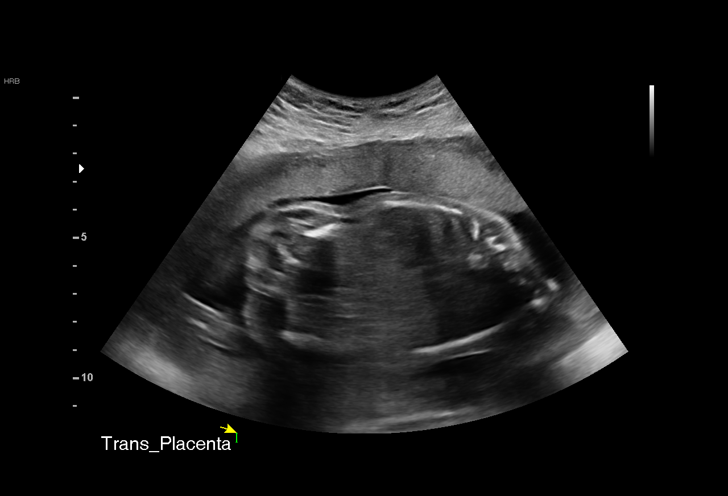
[im 11/21]
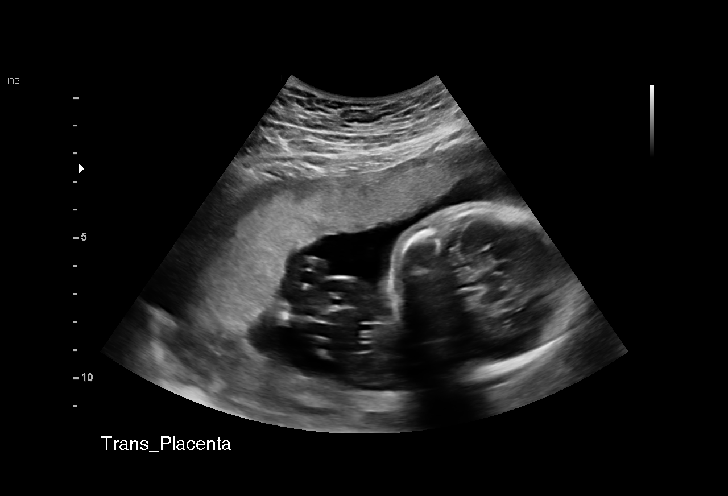
[im 12/21]
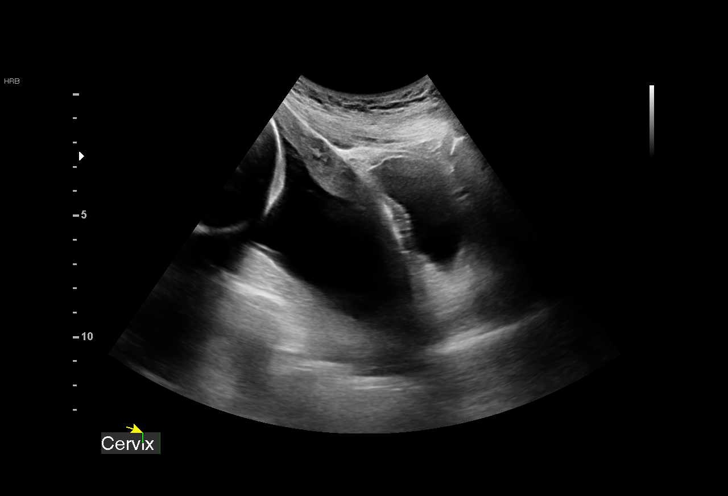
[im 14/21]
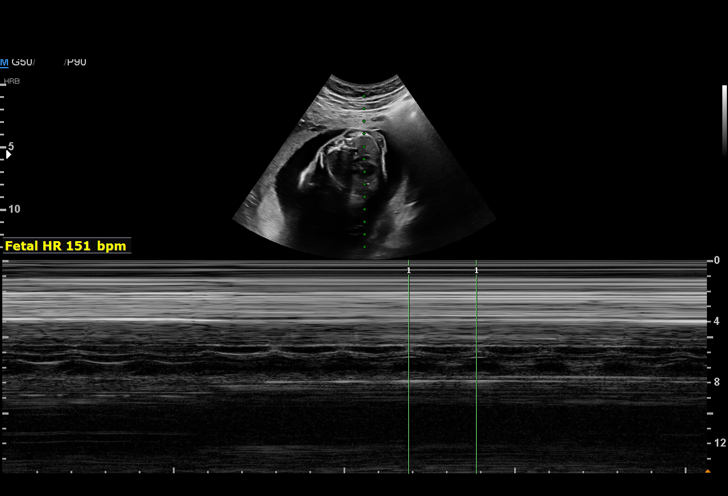
[im 15/21]
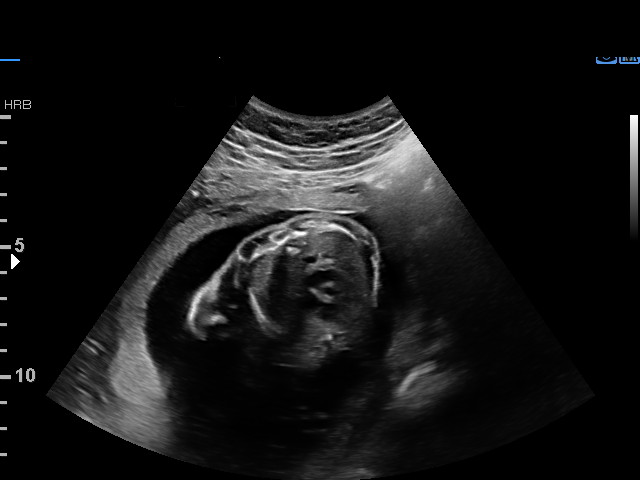
[im 17/21]
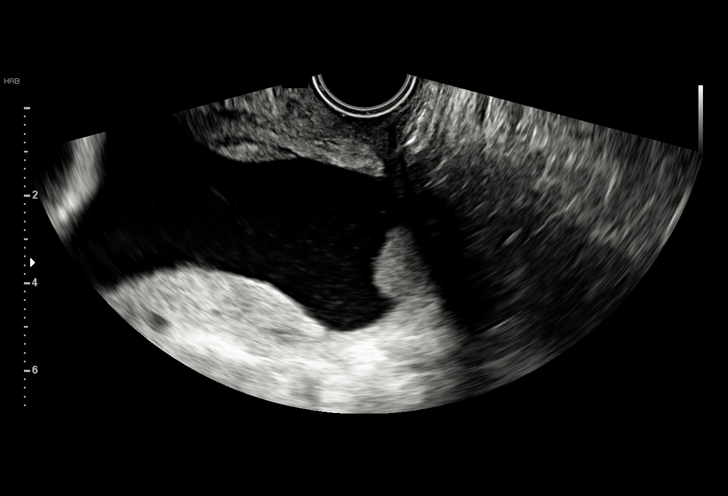
[im 18/21]
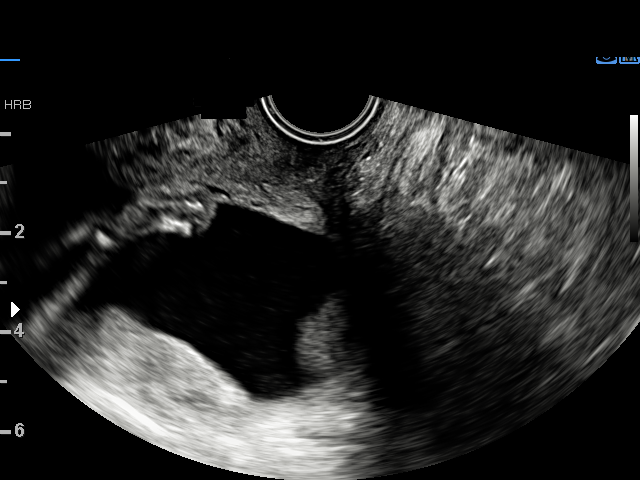
[im 19/21]
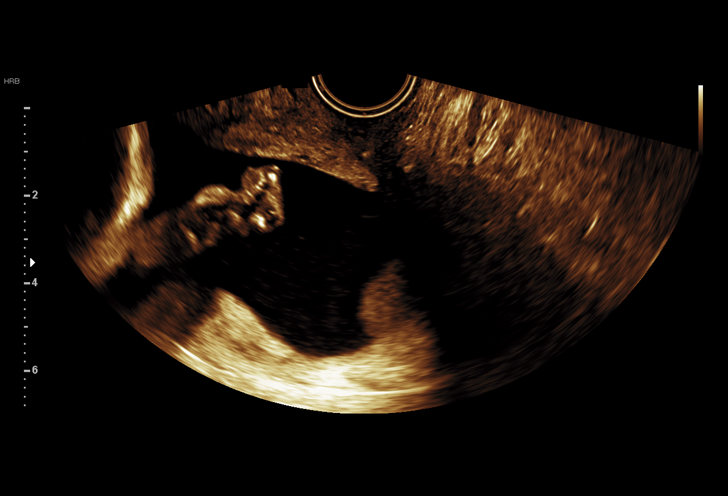
[im 21/21]
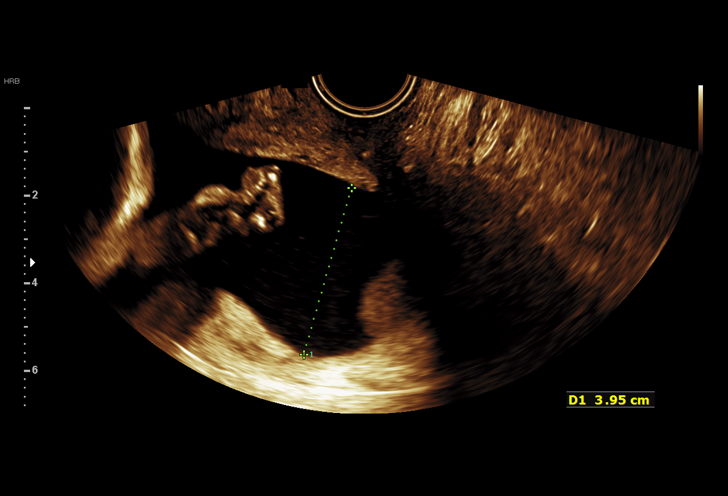

[15 of 21 positions shown; findings below may reference images not displayed]

Attending:        Ruudlslle Sturua       Secondary Phy.:   [facility] Nursing-
                                                            MAU/Triage

  1  US MFM OB TRANSVAGINAL               76817.2      OBITSA TOMANGO
 ----------------------------------------------------------------------

 ----------------------------------------------------------------------
Indications

  23 weeks gestation of pregnancy
  Vaginal bleeding in pregnancy, second
  trimester
 ----------------------------------------------------------------------
Vital Signs

 BMI:
Fetal Evaluation

 Num Of Fetuses:         1
 Fetal Heart Rate(bpm):  151
 Cardiac Activity:       Observed
 Presentation:           Transverse, head to maternal left
 Placenta:               Anterior

 Amniotic Fluid
 AFI FV:      Within normal limits

                             Largest Pocket(cm)

OB History

 Gravidity:    1
Gestational Age

 Clinical EDD:  23w 2d                                        EDD:   10/11/18
 Best:          23w 2d     Det. By:  Clinical EDD             EDD:   10/11/18
Cervix Uterus Adnexa

 Cervix
 Length:              0  cm.
 Funnel Width:        4  cm.
 Appears dilated, see comments.

 Comment
 Non-measurable cervix and amniotic "sludge"
 observed.
Impression

 Live mid-trimester gestation with cervical funeling and non-
 measurable cervix.
Recommendations

 Continue admission until delivery due to elevated risk for
 preterm delivery.
                Dent, Kim

## 2020-01-12 ENCOUNTER — Telehealth (HOSPITAL_COMMUNITY): Payer: Self-pay | Admitting: *Deleted

## 2020-01-12 ENCOUNTER — Encounter (HOSPITAL_COMMUNITY): Payer: Self-pay | Admitting: *Deleted

## 2020-01-12 NOTE — Telephone Encounter (Signed)
Preadmission screen  

## 2020-01-13 ENCOUNTER — Inpatient Hospital Stay (HOSPITAL_COMMUNITY): Payer: BC Managed Care – PPO

## 2020-01-13 ENCOUNTER — Inpatient Hospital Stay (HOSPITAL_COMMUNITY): Payer: BC Managed Care – PPO | Admitting: Anesthesiology

## 2020-01-13 ENCOUNTER — Inpatient Hospital Stay (HOSPITAL_COMMUNITY)
Admission: AD | Admit: 2020-01-13 | Discharge: 2020-01-15 | DRG: 768 | Disposition: A | Discharge status: Home / Self Care | Payer: BC Managed Care – PPO | Attending: Obstetrics and Gynecology | Admitting: Obstetrics and Gynecology

## 2020-01-13 ENCOUNTER — Other Ambulatory Visit: Payer: Self-pay

## 2020-01-13 ENCOUNTER — Inpatient Hospital Stay (HOSPITAL_COMMUNITY)
Admission: AD | Admit: 2020-01-13 | Payer: BC Managed Care – PPO | Source: Home / Self Care | Admitting: Obstetrics and Gynecology

## 2020-01-13 ENCOUNTER — Encounter (HOSPITAL_COMMUNITY): Payer: Self-pay | Admitting: Obstetrics and Gynecology

## 2020-01-13 DIAGNOSIS — O1003 Pre-existing essential hypertension complicating the puerperium: Secondary | ICD-10-CM | POA: Diagnosis not present

## 2020-01-13 DIAGNOSIS — O9081 Anemia of the puerperium: Principal | ICD-10-CM | POA: Diagnosis not present

## 2020-01-13 DIAGNOSIS — Z20822 Contact with and (suspected) exposure to covid-19: Secondary | ICD-10-CM | POA: Diagnosis not present

## 2020-01-13 DIAGNOSIS — D62 Acute posthemorrhagic anemia: Secondary | ICD-10-CM | POA: Diagnosis not present

## 2020-01-13 DIAGNOSIS — O26893 Other specified pregnancy related conditions, third trimester: Secondary | ICD-10-CM | POA: Diagnosis present

## 2020-01-13 DIAGNOSIS — S3763XA Laceration of uterus, initial encounter: Secondary | ICD-10-CM

## 2020-01-13 DIAGNOSIS — Z3A39 39 weeks gestation of pregnancy: Secondary | ICD-10-CM

## 2020-01-13 LAB — CBC
HCT: 35.7 % — ABNORMAL LOW (ref 36.0–46.0)
Hemoglobin: 11.1 g/dL — ABNORMAL LOW (ref 12.0–15.0)
MCH: 27.8 pg (ref 26.0–34.0)
MCHC: 31.1 g/dL (ref 30.0–36.0)
MCV: 89.3 fL (ref 80.0–100.0)
Platelets: 194 10*3/uL (ref 150–400)
RBC: 4 MIL/uL (ref 3.87–5.11)
RDW: 15.2 % (ref 11.5–15.5)
WBC: 5.7 10*3/uL (ref 4.0–10.5)
nRBC: 0 % (ref 0.0–0.2)

## 2020-01-13 LAB — RPR: RPR Ser Ql: NONREACTIVE

## 2020-01-13 LAB — POCT FERN TEST: POCT Fern Test: POSITIVE

## 2020-01-13 LAB — SARS CORONAVIRUS 2 BY RT PCR (HOSPITAL ORDER, PERFORMED IN ~~LOC~~ HOSPITAL LAB): SARS Coronavirus 2: NEGATIVE

## 2020-01-13 LAB — TYPE AND SCREEN
ABO/RH(D): O POS
Antibody Screen: NEGATIVE

## 2020-01-13 MED ORDER — SIMETHICONE 80 MG PO CHEW: 80.0000 mg | CHEWABLE_TABLET | ORAL | Status: DC | PRN

## 2020-01-13 MED ORDER — DIPHENHYDRAMINE HCL 50 MG/ML IJ SOLN: 12.5000 mg | INTRAMUSCULAR | Status: DC | PRN

## 2020-01-13 MED ORDER — LACTATED RINGERS IV SOLN: 500.0000 mL | INTRAVENOUS | Status: DC | PRN

## 2020-01-13 MED ORDER — EPHEDRINE 5 MG/ML INJ: 10.0000 mg | INTRAVENOUS | Status: DC | PRN

## 2020-01-13 MED ORDER — SODIUM CHLORIDE (PF) 0.9 % IJ SOLN
INTRAMUSCULAR | Status: DC | PRN
  Administered 2020-01-13: 12 mL/h via EPIDURAL

## 2020-01-13 MED ORDER — LIDOCAINE HCL (PF) 1 % IJ SOLN: 30.0000 mL | INTRAMUSCULAR | Status: DC | PRN

## 2020-01-13 MED ORDER — PHENYLEPHRINE 40 MCG/ML (10ML) SYRINGE FOR IV PUSH (FOR BLOOD PRESSURE SUPPORT): 80.0000 ug | PREFILLED_SYRINGE | INTRAVENOUS | Status: DC | PRN

## 2020-01-13 MED ORDER — FENTANYL CITRATE (PF) 100 MCG/2ML IJ SOLN
100.0000 ug | INTRAMUSCULAR | Status: DC | PRN
  Administered 2020-01-13: 100 ug via INTRAVENOUS
  Filled 2020-01-13 (×2): qty 2

## 2020-01-13 MED ORDER — DIBUCAINE (PERIANAL) 1 % EX OINT: 1.0000 "application " | TOPICAL_OINTMENT | CUTANEOUS | Status: DC | PRN

## 2020-01-13 MED ORDER — OXYTOCIN-SODIUM CHLORIDE 30-0.9 UT/500ML-% IV SOLN
2.5000 [IU]/h | INTRAVENOUS | Status: DC
  Filled 2020-01-13: qty 500

## 2020-01-13 MED ORDER — PHENYLEPHRINE 40 MCG/ML (10ML) SYRINGE FOR IV PUSH (FOR BLOOD PRESSURE SUPPORT)
80.0000 ug | PREFILLED_SYRINGE | INTRAVENOUS | Status: DC | PRN
  Filled 2020-01-13: qty 10

## 2020-01-13 MED ORDER — METHYLERGONOVINE MALEATE 0.2 MG PO TABS: 0.2000 mg | ORAL_TABLET | ORAL | Status: DC | PRN

## 2020-01-13 MED ORDER — METHYLERGONOVINE MALEATE 0.2 MG/ML IJ SOLN: 0.2000 mg | INTRAMUSCULAR | Status: DC | PRN

## 2020-01-13 MED ORDER — BUPIVACAINE HCL (PF) 0.25 % IJ SOLN
INTRAMUSCULAR | Status: DC | PRN
  Administered 2020-01-13: 10 mL via EPIDURAL
  Administered 2020-01-13: 8 mL via EPIDURAL

## 2020-01-13 MED ORDER — FENTANYL CITRATE (PF) 100 MCG/2ML IJ SOLN
100.0000 ug | Freq: Once | INTRAMUSCULAR | Status: AC
  Administered 2020-01-13: 100 ug via EPIDURAL

## 2020-01-13 MED ORDER — ACETAMINOPHEN 325 MG PO TABS
650.0000 mg | ORAL_TABLET | ORAL | Status: DC | PRN
  Administered 2020-01-13: 650 mg via ORAL
  Filled 2020-01-13: qty 2

## 2020-01-13 MED ORDER — OXYTOCIN-SODIUM CHLORIDE 30-0.9 UT/500ML-% IV SOLN
1.0000 m[IU]/min | INTRAVENOUS | Status: DC
  Administered 2020-01-13: 2 m[IU]/min via INTRAVENOUS

## 2020-01-13 MED ORDER — ACETAMINOPHEN 325 MG PO TABS
650.0000 mg | ORAL_TABLET | ORAL | Status: DC | PRN
  Administered 2020-01-14 – 2020-01-15 (×4): 650 mg via ORAL
  Filled 2020-01-13 (×4): qty 2

## 2020-01-13 MED ORDER — ZOLPIDEM TARTRATE 5 MG PO TABS: 5.0000 mg | ORAL_TABLET | Freq: Every evening | ORAL | Status: DC | PRN

## 2020-01-13 MED ORDER — OXYCODONE-ACETAMINOPHEN 5-325 MG PO TABS
1.0000 | ORAL_TABLET | ORAL | Status: DC | PRN
  Administered 2020-01-13: 1 via ORAL
  Filled 2020-01-13: qty 1

## 2020-01-13 MED ORDER — OXYCODONE-ACETAMINOPHEN 5-325 MG PO TABS: 2.0000 | ORAL_TABLET | ORAL | Status: DC | PRN

## 2020-01-13 MED ORDER — FENTANYL-BUPIVACAINE-NACL 0.5-0.125-0.9 MG/250ML-% EP SOLN
12.0000 mL/h | EPIDURAL | Status: DC | PRN
  Filled 2020-01-13: qty 250

## 2020-01-13 MED ORDER — DIPHENHYDRAMINE HCL 25 MG PO CAPS: 25.0000 mg | ORAL_CAPSULE | Freq: Four times a day (QID) | ORAL | Status: DC | PRN

## 2020-01-13 MED ORDER — ONDANSETRON HCL 4 MG/2ML IJ SOLN: 4.0000 mg | Freq: Four times a day (QID) | INTRAMUSCULAR | Status: DC | PRN

## 2020-01-13 MED ORDER — SOD CITRATE-CITRIC ACID 500-334 MG/5ML PO SOLN: 30.0000 mL | ORAL | Status: DC | PRN

## 2020-01-13 MED ORDER — FENTANYL CITRATE (PF) 100 MCG/2ML IJ SOLN
INTRAMUSCULAR | Status: DC | PRN
  Administered 2020-01-13: 100 ug via EPIDURAL

## 2020-01-13 MED ORDER — OXYCODONE-ACETAMINOPHEN 5-325 MG PO TABS: 1.0000 | ORAL_TABLET | ORAL | Status: DC | PRN

## 2020-01-13 MED ORDER — ONDANSETRON HCL 4 MG PO TABS: 4.0000 mg | ORAL_TABLET | ORAL | Status: DC | PRN

## 2020-01-13 MED ORDER — SENNOSIDES-DOCUSATE SODIUM 8.6-50 MG PO TABS
2.0000 | ORAL_TABLET | ORAL | Status: DC
  Administered 2020-01-13 – 2020-01-14 (×2): 2 via ORAL
  Filled 2020-01-13 (×2): qty 2

## 2020-01-13 MED ORDER — TERBUTALINE SULFATE 1 MG/ML IJ SOLN: 0.2500 mg | Freq: Once | INTRAMUSCULAR | Status: DC | PRN

## 2020-01-13 MED ORDER — COCONUT OIL OIL
1.0000 "application " | TOPICAL_OIL | Status: DC | PRN
  Administered 2020-01-15: 1 via TOPICAL

## 2020-01-13 MED ORDER — BENZOCAINE-MENTHOL 20-0.5 % EX AERO
1.0000 "application " | INHALATION_SPRAY | CUTANEOUS | Status: DC | PRN
  Administered 2020-01-13: 1 via TOPICAL
  Filled 2020-01-13: qty 56

## 2020-01-13 MED ORDER — TETANUS-DIPHTH-ACELL PERTUSSIS 5-2.5-18.5 LF-MCG/0.5 IM SUSP: 0.5000 mL | Freq: Once | INTRAMUSCULAR | Status: DC

## 2020-01-13 MED ORDER — LACTATED RINGERS IV SOLN: INTRAVENOUS | Status: DC

## 2020-01-13 MED ORDER — ONDANSETRON HCL 4 MG/2ML IJ SOLN: 4.0000 mg | INTRAMUSCULAR | Status: DC | PRN

## 2020-01-13 MED ORDER — LIDOCAINE HCL (PF) 1 % IJ SOLN
INTRAMUSCULAR | Status: DC | PRN
  Administered 2020-01-13: 11 mL via EPIDURAL

## 2020-01-13 MED ORDER — WITCH HAZEL-GLYCERIN EX PADS: 1.0000 "application " | MEDICATED_PAD | CUTANEOUS | Status: DC | PRN

## 2020-01-13 MED ORDER — IBUPROFEN 600 MG PO TABS
600.0000 mg | ORAL_TABLET | Freq: Four times a day (QID) | ORAL | Status: DC
  Administered 2020-01-13 – 2020-01-15 (×7): 600 mg via ORAL
  Filled 2020-01-13 (×7): qty 1

## 2020-01-13 MED ORDER — MISOPROSTOL 50MCG HALF TABLET
50.0000 ug | ORAL_TABLET | ORAL | Status: DC | PRN
  Administered 2020-01-13: 50 ug via ORAL
  Filled 2020-01-13: qty 1

## 2020-01-13 MED ORDER — LACTATED RINGERS IV SOLN: 500.0000 mL | Freq: Once | INTRAVENOUS | Status: DC

## 2020-01-13 MED ORDER — PRENATAL MULTIVITAMIN CH
1.0000 | ORAL_TABLET | Freq: Every day | ORAL | Status: DC
  Administered 2020-01-14 – 2020-01-15 (×2): 1 via ORAL
  Filled 2020-01-13 (×2): qty 1

## 2020-01-13 MED ORDER — OXYTOCIN BOLUS FROM INFUSION
333.0000 mL | Freq: Once | INTRAVENOUS | Status: AC
  Administered 2020-01-13: 333 mL via INTRAVENOUS

## 2020-01-13 NOTE — MAU Note (Signed)
Pt states water broke at 1230, clear/pink fluid. Pt denies contractions or vaginal bleeding. Good fetal movement. Cervix was 2cm last week.

## 2020-01-13 NOTE — H&P (Signed)
Brenda Wood is a 29 y.o. female presenting for SROM at term. OB History    Gravida  2   Para  1   Term      Preterm  1   AB      Living  0     SAB      TAB      Ectopic      Multiple  0   Live Births  1          Past Medical History:  Diagnosis Date  . Alpha-1-antitrypsin deficiency [facility])    Past Surgical History:  Procedure Laterality Date  . CERVICAL CERCLAGE N/A 07/16/2019   Procedure: Brenda Wood CERVICAL;  Surgeon: Brenda Mackie, MD;  Location: [facility] LD ORS;  Service: Gynecology;  Laterality: N/A;  EDD: 01/20/20  Brenda Wood RNFA  . WISDOM TOOTH EXTRACTION     Family History: family history includes Alpha-1 antitrypsin deficiency in her father; COPD in her father; Diabetes in her maternal grandfather and paternal grandmother; Hypertension in her father. Social History:  reports that she has never smoked. She has never used smokeless tobacco. She reports that she does not drink alcohol and does not use drugs.     Maternal Diabetes: No Genetic Screening: Normal Maternal Ultrasounds/Referrals: Normal Fetal Ultrasounds or other Referrals:  None Maternal Substance Abuse:  No Significant Maternal Medications:  None Significant Maternal Lab Results:  Group B Strep negative Other Comments:  None  Review of Systems  Constitutional: Negative.   All other systems reviewed and are negative.  Maternal Medical History:  Reason for admission: Rupture of membranes.   Contractions: Onset was 1-2 hours ago.   Frequency: irregular.   Perceived severity is mild.    Fetal activity: Perceived fetal activity is normal.   Last perceived fetal movement was within the past hour.    Prenatal complications: no prenatal complications Prenatal Complications - Diabetes: none.    Dilation: 3 Effacement (%): 50 Station: -3 Exam by:: Brenda Wood Blood pressure 120/71, pulse 64, temperature 98 F (36.7 C), temperature source Oral, resp. rate 18, height 5\' 3"  (1.6  m), weight 86.3 kg, SpO2 100 %, unknown if currently breastfeeding. Maternal Exam:  Uterine Assessment: Contraction strength is mild.  Contraction frequency is irregular.   Abdomen: Patient reports no abdominal tenderness. Fetal presentation: vertex  Introitus: Normal vulva. Normal vagina.  Ferning test: positive.  Nitrazine test: positive. Amniotic fluid character: clear.  Pelvis: questionable for delivery.   Cervix: Cervix evaluated by digital exam.     Physical Exam Vitals and nursing note reviewed.  Constitutional:      Appearance: Normal appearance. She is normal weight.  HENT:     Head: Normocephalic and atraumatic.  Cardiovascular:     Rate and Rhythm: Normal rate and regular rhythm.     Pulses: Normal pulses.     Heart sounds: Normal heart sounds.  Pulmonary:     Effort: Pulmonary effort is normal.     Breath sounds: Normal breath sounds.  Abdominal:     Palpations: Abdomen is soft.  Genitourinary:    General: Normal vulva.  Musculoskeletal:        General: Normal range of motion.     Cervical back: Normal range of motion and neck supple.  Skin:    General: Skin is warm and dry.  Neurological:     General: No focal deficit present.     Mental Status: She is alert and oriented to person, place, and time.  Prenatal labs: ABO, Rh: --/--/O POS (07/07 0400) Antibody: NEG (07/07 0400) Rubella: Immune (12/09 0000) RPR: Nonreactive (12/09 0000)  HBsAg: Negative (12/09 0000)  HIV: Non-reactive (12/09 0000)  GBS: Negative, Negative/-- (12/30 0000)   Assessment/Plan: Term IUP with sROM EFW > 4000gms Admit   Brenda Wood J 01/13/2020, 6:55 AM

## 2020-01-13 NOTE — Anesthesia Preprocedure Evaluation (Signed)

## 2020-01-13 NOTE — Anesthesia Procedure Notes (Signed)
Epidural Patient location during procedure: OB Start time: 01/13/2020 10:13 AM End time: 01/13/2020 10:27 AM  Staffing Anesthesiologist: Lowella Curb, MD Performed: anesthesiologist   Preanesthetic Checklist Completed: patient identified, IV checked, site marked, risks and benefits discussed, surgical consent, monitors and equipment checked, pre-op evaluation and timeout performed  Epidural Patient position: sitting Prep: ChloraPrep Patient monitoring: heart rate, cardiac monitor, continuous pulse ox and blood pressure Approach: midline Location: L2-L3 Injection technique: LOR saline  Needle:  Needle type: Tuohy  Needle gauge: 17 G Needle length: 9 cm Needle insertion depth: 7 cm Catheter type: closed end flexible Catheter size: 20 Guage Catheter at skin depth: 11 cm Test dose: negative  Assessment Events: blood not aspirated, injection not painful, no injection resistance, no paresthesia and negative IV test  Additional Notes Epidural placed by SRNA under direct supervisionReason for block:procedure for pain

## 2020-01-13 NOTE — Progress Notes (Signed)
Shere Eisenhart is a 29 y.o. G2P0100 at [redacted]w[redacted]d by LMP admitted for rupture of membranes  Subjective: Comfortable with epidural  Objective: BP 125/85   Pulse 74   Temp 98.1 F (36.7 C) (Oral)   Resp 17   Ht 5\' 3"  (1.6 m)   Wt 86.3 kg   SpO2 100%   BMI 33.71 kg/m  No intake/output data recorded. No intake/output data recorded.  FHT:  FHR: 120 bpm, variability: moderate,  accelerations:  Present,  decelerations:  Absent UC:   regular, every 1-3 minutes SVE:   Dilation: 6.5 Effacement (%): 80 Station: -1, -2 Exam by:: [email or web address] RNC  Labs: Lab Results  Component Value Date   WBC 5.7 01/13/2020   HGB 11.1 (L) 01/13/2020   HCT 35.7 (L) 01/13/2020   MCV 89.3 01/13/2020   PLT 194 01/13/2020    Assessment / Plan: Augmentation of labor, progressing well  Labor: Progressing normally Preeclampsia:  no signs or symptoms of toxicity Fetal Wellbeing:  Category I Pain Control:  Epidural I/D:  n/a Anticipated MOD:  guarded  Sunset Joshi J 01/13/2020, 12:05 PM

## 2020-01-14 LAB — CBC
HCT: 29.8 % — ABNORMAL LOW (ref 36.0–46.0)
Hemoglobin: 9.4 g/dL — ABNORMAL LOW (ref 12.0–15.0)
MCH: 28.1 pg (ref 26.0–34.0)
MCHC: 31.5 g/dL (ref 30.0–36.0)
MCV: 89 fL (ref 80.0–100.0)
Platelets: 191 10*3/uL (ref 150–400)
RBC: 3.35 MIL/uL — ABNORMAL LOW (ref 3.87–5.11)
RDW: 15.3 % (ref 11.5–15.5)
WBC: 13.5 10*3/uL — ABNORMAL HIGH (ref 4.0–10.5)
nRBC: 0 % (ref 0.0–0.2)

## 2020-01-14 MED ORDER — POLYSACCHARIDE IRON COMPLEX 150 MG PO CAPS
150.0000 mg | ORAL_CAPSULE | Freq: Every day | ORAL | Status: DC
  Administered 2020-01-15: 150 mg via ORAL
  Filled 2020-01-14: qty 1

## 2020-01-14 NOTE — Lactation Note (Signed)
This note was copied from a baby's chart. Lactation Consultation Note  Patient Name: Brenda Wood [id number] Date: 01/14/2020 Reason for consult: Follow-up assessment Baby is 39hrs old, upon entering room mom sitting on bed holding sleeping baby. Mom reports plans to exclusively pump and offer EBM to baby, states this has always been her plan and would like to offer EBM for 6-32months. Advised importance of maintaining a consistent pump schedule at least q3hrs including over night. Advised at this stage DEBP is for stimulation, hand expression is best for removal of colostrum, both are crucial for milk production, removal and maintenance. Mom reports obtaining ~48ml of colostrum with hand expression, mom declined offer for LC to assist. Instructions given on how to hand express, encouraged mom to watch video on hand expression (link given), warm pack given, advised to use prior to hand expression, expect to collect 5-47ml per session. Reinforced pumping q3hrs + hand express before and after DEBP if needed to obtain expected volume. Mom voiced understanding and with no further concerns. Advised to call for [facility] support as needed. BGilliam, RN, IBCLC  Maternal Data    Feeding    LATCH Score                   Interventions Interventions: Hand express;DEBP  Lactation Tools Discussed/Used     Consult Status Consult Status: Follow-up Date: 01/15/20 Follow-up type: In-patient    Brenda Wood 01/14/2020, 10:36 PM

## 2020-01-14 NOTE — Lactation Note (Addendum)
This note was copied from a baby's chart. Lactation Consultation Note  Patient Name: Brenda Wood [id number] Date: 01/14/2020 Reason for consult: Initial assessment;Term;Other (Comment) (LGA greater 9 lbs) P1, 7 hour term female infant. Mom has flat nipples, that responses well when doing breast stimulation or pre-pumping breast. Tools given: DEBP by RN, hand pump by LC due to mom having flat nipples.  Mom  Initial plan was  to pump and bottle feeding infant donor breast milk only  and not latching infant at breast. LC discussed donor breast milk policy and establishing  Mom's milk supply, mom  will try to  latch infant at breast. Mom was concern that when she pumped,  she only saw drop of colostrum.  LC explained to mom that colostrum is very thick and suggested hand expression. Mom easily expressed 5 mls of colostrum that was spoon fed to infant. LC had mom to do breast stimulation and expressed small amount of colostrum prior to latching infant at breast.  Mom was  fitted with 20 mm NS, mom did not like NS, mom removed it and infant latched without NS and breastfed for 12 minutes. Mom will continue to work towards latching infant at breast. LC explained donor breast milk must be discarded after one hour not 4 hours. Mom will BF infant according to hunger cues, 8 to 12+ times within 24 hours and on demand. Mom knows to call RN or LC if she has any questions, concerns or needs assistance with latching infant at breast. Mom plans to use DEBP every 3 hours for 15 minutes on initial setting. Mom shown how to use DEBP & how to disassemble, clean, & reassemble parts. Mom made aware of O/P services, breastfeeding support groups, community resources, and our phone # for post-discharge questions.   Maternal Data Formula Feeding for Exclusion: No Has patient been taught Hand Expression?: Yes Does the patient have breastfeeding experience prior to this delivery?: Yes  Feeding Feeding Type: Breast  Fed  LATCH Score Latch: Repeated attempts needed to sustain latch, nipple held in mouth throughout feeding, stimulation needed to elicit sucking reflex.  Audible Swallowing: A few with stimulation  Type of Nipple: Flat  Comfort (Breast/Nipple): Soft / non-tender  Hold (Positioning): Assistance needed to correctly position infant at breast and maintain latch.  LATCH Score: 6  Interventions Interventions: Breast feeding basics reviewed;Assisted with latch;Breast compression;Skin to skin;Adjust position;Breast massage;Support pillows;Hand pump;Hand express;Position options;Pre-pump if needed;DEBP;Expressed milk  Lactation Tools Discussed/Used Tools: Nipple Shields Nipple shield size: 20 WIC Program: No Pump Review: Setup, frequency, and cleaning;Milk Storage Initiated by:: RN Date initiated:: 01/14/20   Consult Status Consult Status: Follow-up Date: 01/15/20 Follow-up type: In-patient    Brenda Wood 01/14/2020, 2:59 AM

## 2020-01-14 NOTE — Progress Notes (Addendum)
PPD # 1 S/P VAVD  Live born female  Birth Weight: 9 lb 4 oz (4195 g) APGAR: 6, 9  Newborn Delivery   Time head delivered: 01/13/2020 19:05:00 Birth date/time: 01/13/2020 19:06:00 Delivery type: Vaginal, Vacuum (Extractor)     Baby name: Cleona Delivering provider: Olivia Mackie  Episiotomy:Median   Lacerations:Cervical;3rd degree   circumcision Yes, desires inpatient  Feeding: breast, desires to exclusively pump breastmilk  Pain control at delivery: Epidural   S:  Reports feeling sore; states her perineum is very sore,m especially after sitting for longer periods of time             Tolerating po/ No nausea or vomiting             Bleeding is moderate             Pain controlled with acetaminophen and ibuprofen (OTC)             Up ad lib / ambulatory / voiding without difficulties   O:  A & O x 3, in no apparent distress              VS:  Vitals:   01/13/20 2303 01/14/20 0310 01/14/20 0720 01/14/20 1050  BP: 124/70 117/64 120/70 118/72  Pulse: 98 83 89 85  Resp: 16 18 18 18   Temp: 98.8 F (37.1 C) 98.2 F (36.8 C) 97.9 F (36.6 C) 98.1 F (36.7 C)  TempSrc: Oral Oral Oral Oral  SpO2: 98% 98% 100% 100%  Weight:      Height:        LABS:  Recent Labs    01/13/20 0400 01/14/20 0555  WBC 5.7 13.5*  HGB 11.1* 9.4*  HCT 35.7* 29.8*  PLT 194 191    Blood type: --/--/O POS (07/07 0400)  Rubella: Immune (12/09 0000)   I&O: I/O last 3 completed shifts: In: -  Out: 1009 [Urine:525; Blood:484]          No intake/output data recorded.  Vaccines: TDaP UTD         Flu    UTD   Gen: AAO x 3, NAD  Abdomen: soft, non-tender, non-distended             Fundus: firm, non-tender, U-1  Perineum: healing, no edema or hematoma   Lochia: modertate  Extremities: no edema, no calf pain or tenderness   A/P: PPD # 1 29 y.o., 10-13-1976   Principal Problem:   Postpartum care following vaginal delivery 7/7 Active Problems:   Normal labor   Vaginal delivery 7/7   Cervical  laceration   Doing well - stable status  Routine post partum orders             Will start PO Niferex for anemia             Encourage regular pericare and the use of ice and Dermoplast prn             Encourage herbal sitz baths after discharge  Anticipate discharge tomorrow   9/7, MSN, CNM 01/14/2020, 12:24 PM

## 2020-01-14 NOTE — Anesthesia Postprocedure Evaluation (Signed)
Anesthesia Post Note  Patient: [name]  Procedure(s) Performed: AN AD HOC LABOR EPIDURAL     Patient location during evaluation: Mother Baby Anesthesia Type: Epidural Level of consciousness: awake and alert Pain management: pain level controlled Respiratory status: spontaneous breathing Cardiovascular status: stable Postop Assessment: no headache, adequate PO intake, no backache, patient able to bend at knees, able to ambulate, epidural receding and no apparent nausea or vomiting Anesthetic complications: no   No complications documented.  Last Vitals:  Vitals:   01/14/20 0310 01/14/20 0720  BP: 117/64 120/70  Pulse: 83 89  Resp: 18 18  Temp: 36.8 C 36.6 C  SpO2: 98% 100%    Last Pain:  Vitals:   01/14/20 0720  TempSrc: Oral  PainSc: 4    Pain Goal:                   Salome Arnt

## 2020-01-15 ENCOUNTER — Other Ambulatory Visit (HOSPITAL_COMMUNITY): Payer: BC Managed Care – PPO

## 2020-01-15 MED ORDER — IBUPROFEN 600 MG PO TABS: 600.0000 mg | ORAL_TABLET | Freq: Four times a day (QID) | ORAL | 0 refills | Status: AC

## 2020-01-15 MED ORDER — COCONUT OIL OIL: 1.0000 "application " | TOPICAL_OIL | 0 refills | Status: AC | PRN

## 2020-01-15 MED ORDER — POLYSACCHARIDE IRON COMPLEX 150 MG PO CAPS: 150.0000 mg | ORAL_CAPSULE | Freq: Every day | ORAL | 0 refills | Status: AC

## 2020-01-15 MED ORDER — DOCUSATE SODIUM 100 MG PO CAPS: 100.0000 mg | ORAL_CAPSULE | Freq: Two times a day (BID) | ORAL | 0 refills | Status: AC

## 2020-01-15 MED ORDER — SENNOSIDES-DOCUSATE SODIUM 8.6-50 MG PO TABS: 2.0000 | ORAL_TABLET | ORAL | 0 refills | Status: AC

## 2020-01-15 MED ORDER — BENZOCAINE-MENTHOL 20-0.5 % EX AERO: 1.0000 "application " | INHALATION_SPRAY | CUTANEOUS | Status: AC | PRN

## 2020-01-15 MED ORDER — DOCUSATE SODIUM 100 MG PO CAPS
100.0000 mg | ORAL_CAPSULE | Freq: Two times a day (BID) | ORAL | Status: DC
  Administered 2020-01-15: 100 mg via ORAL
  Filled 2020-01-15: qty 1

## 2020-01-15 MED ORDER — ACETAMINOPHEN 325 MG PO TABS: 650.0000 mg | ORAL_TABLET | ORAL | 0 refills | Status: AC | PRN

## 2020-01-15 NOTE — Lactation Note (Signed)
This note was copied from a baby's chart. Lactation Consultation Note  Patient Name: Boy Carina Chaplin [id number] Date: 01/15/2020 Reason for consult: Follow-up assessment;1st time breastfeeding;Term  1307 - 1314 - LC followed up with Ms. Pangelinan to check on her progress with pumping and feeding baby. She states that she is using her DEBP and manual pump. She prefers the manual pump at this time as she feels it helps remove colostrum more effectively than the electric. We discussed when to expect her mature milk to transition.  We reviewed hands on pumping. Ms. Fullman states that she has learned a lot from previous LC visits and does not have any questions or concerns at this time. She declined lactation assistance. We discussed the effectiveness of hand expression as well for removal of colostrum.   I recommended that she continue to pump for stimulation and reviewed what to expect in terms of milk volume over the next 24-48 hours.  I suggested that LC follow up tomorrow prior to discharge to check on her progress, and she verbalized understanding.   Feeding Feeding Type: Bottle Fed - Breast Milk Nipple Type: Extra Slow Flow   Interventions Interventions: Breast feeding basics reviewed  Lactation Tools Discussed/Used Breast pump type: Double-Electric Breast Pump;Manual Pump Review: Setup, frequency, and cleaning   Consult Status Consult Status: Follow-up Date: 01/16/20 Follow-up type: In-patient    Walker Shadow 01/15/2020, 1:22 PM

## 2020-01-15 NOTE — Discharge Summary (Addendum)
Postpartum Discharge Summary  Date of Service updated 01/15/2020     Patient Name: Brenda Wood DOB: Apr 18, 1991 MRN: [id number]  Date of admission: 01/13/2020 Delivery date:01/13/2020  Delivering provider: Brien Few  Date of discharge: 01/15/2020  Admitting diagnosis: Normal labor [O80, Z37.9] Intrauterine pregnancy: [redacted]w[redacted]d    Secondary diagnosis:  Principal Problem:   Postpartum care following vaginal delivery 7/7 Active Problems:   Normal labor   Vaginal delivery 7/7   Cervical laceration  Additional problems: Median episiotomy with Third degree perineal laceration, Vacuum assisted vaginal delivery, ABL anemia     Discharge diagnosis: Term Pregnancy Delivered and Anemia                                              Post partum procedures:n/a Augmentation: Pitocin Complications: None  Hospital course: Onset of Labor With Vaginal Delivery      29y.o. yo G2P1101 at 342w0das admitted in Latent Labor on 01/13/2020. Patient had an uncomplicated labor course as follows:  Membrane Rupture Time/Date: 12:30 AM ,01/13/2020   Delivery Method:Vaginal, Vacuum (Extractor)  Episiotomy: Median  Lacerations:  Cervical;3rd degree  Patient had an uncomplicated postpartum course.  She is ambulating, tolerating a regular diet, passing flatus, and urinating well. Patient is discharged home in stable condition on 01/15/20.  Newborn Data: Birth date:01/13/2020  Birth time:7:06 PM  Gender:Female  Living status:Living  Apgars:6 ,9  Weight:4195 g   Magnesium Sulfate received: No BMZ received: No Rhophylac:N/A MMR:No T-DaP:Given prenatally Flu: Yes given prenatally  Transfusion:No  Physical exam  Vitals:   01/14/20 1050 01/14/20 1400 01/14/20 2100 01/15/20 0545  BP: 118/72 123/74 121/80 115/80  Pulse: 85 86 79 94  Resp: '18 18 18 16  '$ Temp: 98.1 F (36.7 C) 97.6 F (36.4 C) 98.2 F (36.8 C) 98.2 F (36.8 C)  TempSrc: Oral Oral Oral Oral  SpO2: 100% 100%    Weight:      Height:        Alert and oriented X3             Lungs: Clear and unlabored             Heart: regular rate and rhythm / no murmurs             Abdomen: soft, non-tender, non-distended              Fundus: firm, non-tender, U-3             Perineum: well approximated 3rd degree; (+) erythema at base of perineum and edema;             Lochia: moderate, no clots             Extremities: trace LEedema, no calf pain or tenderness Labs: Lab Results  Component Value Date   WBC 13.5 (H) 01/14/2020   HGB 9.4 (L) 01/14/2020   HCT 29.8 (L) 01/14/2020   MCV 89.0 01/14/2020   PLT 191 01/14/2020   No flowsheet data found. Edinburgh Score: Edinburgh Postnatal Depression Scale Screening Tool 01/15/2020  I have been able to laugh and see the funny side of things. 0  I have looked forward with enjoyment to things. 0  I have blamed myself unnecessarily when things went wrong. 1  I have been anxious or worried for no good reason. 1  I have felt  scared or panicky for no good reason. 0  Things have been getting on top of me. 1  I have been so unhappy that I have had difficulty sleeping. 0  I have felt sad or miserable. 0  I have been so unhappy that I have been crying. 0  The thought of harming myself has occurred to me. 0  Edinburgh Postnatal Depression Scale Total 3      After visit meds:  Allergies as of 01/15/2020   No Known Allergies     Medication List    STOP taking these medications   ferrous sulfate 325 (65 FE) MG tablet     TAKE these medications   acetaminophen 325 MG tablet Commonly known as: Tylenol Take 2 tablets (650 mg total) by mouth every 4 (four) hours as needed (for pain scale < 4).   benzocaine-Menthol 20-0.5 % Aero Commonly known as: DERMOPLAST Apply 1 application topically as needed for irritation (perineal discomfort).   cetirizine 10 MG tablet Commonly known as: ZYRTEC Take 10 mg by mouth daily as needed for allergies.   coconut oil Oil Apply 1 application topically  as needed.   docusate sodium 100 MG capsule Commonly known as: COLACE Take 1 capsule (100 mg total) by mouth 2 (two) times daily.   fluticasone 50 MCG/ACT nasal spray Commonly known as: FLONASE Place 1 spray into both nostrils daily.   ibuprofen 600 MG tablet Commonly known as: ADVIL Take 1 tablet (600 mg total) by mouth every 6 (six) hours.   iron polysaccharides 150 MG capsule Commonly known as: NIFEREX Take 1 capsule (150 mg total) by mouth daily. Start taking on: January 16, 2020   prenatal multivitamin Tabs tablet Take 1 tablet by mouth daily at 12 noon.   senna-docusate 8.6-50 MG tablet Commonly known as: Senokot-S Take 2 tablets by mouth daily. Start taking on: January 16, 2020            Discharge Care Instructions  (From admission, onward)         Start     Ordered   01/15/20 0000  Discharge wound care:       Comments: Warm water sitz baths 2-3 times per day as needed   01/15/20 1235           Discharge home in stable condition Infant Feeding: Breast Infant Disposition:rooming in Discharge instruction: per After Visit Summary and Postpartum booklet.WOB discharge book given.  Activity: Advance as tolerated. Pelvic rest for 6 weeks.  Diet: low salt diet Anticipated Birth Control: Unsure Postpartum Appointment:2 weeks Additional Postpartum F/U: Postpartum Depression checkup and Incision check 2 week Future Appointments:No future appointments. Follow up Visit:  Follow-up Information    Brien Few, MD. Schedule an appointment as soon as possible for a visit in 2 week(s).   Specialty: Obstetrics and Gynecology Why: Repair check; then 6 week PP visit Contact information: [address] [place] [address] [phone]                   01/15/2020 Darliss Cheney, CNM

## 2020-01-15 NOTE — Progress Notes (Addendum)
PPD #2, VAVD, 3rd degree and cervical lacerations, baby boy "Kobe"  S:  Reports feeling pain from repair after bowel movement today              Tolerating po/ No nausea or vomiting / Denies dizziness or SOB             Bleeding is moderate             Pain controlled with Motrin and Dermoplast             Up ad lib / ambulatory / voiding QS  Newborn breast and formula feeding; mom exclusively pumping / Circumcision - completed   O:               VS: BP 115/80 (BP Location: Right Arm)   Pulse 94   Temp 98.2 F (36.8 C) (Oral)   Resp 16   Ht 5\' 3"  (1.6 m)   Wt 86.3 kg   SpO2 100%   Breastfeeding Unknown   BMI 33.71 kg/m    LABS:              Recent Labs    01/13/20 0400 01/14/20 0555  WBC 5.7 13.5*  HGB 11.1* 9.4*  PLT 194 191               Blood type: --/--/O POS (07/07 0400)  Rubella: Immune (12/09 0000)                     I&O: Intake/Output      07/08 0701 - 07/09 0700 07/09 0701 - 07/10 0700   I.V. (mL/kg) 0 (0)    Other 0    Total Intake(mL/kg) 0 (0)    Urine (mL/kg/hr)     Blood     Total Output     Net 0                       Physical Exam:             Alert and oriented X3  Lungs: Clear and unlabored  Heart: regular rate and rhythm / no murmurs  Abdomen: soft, non-tender, non-distended              Fundus: firm, non-tender, U-3  Perineum: well approximated 3rd degree; (+) erythema at base of perineum and edema;  Lochia: moderate, no clots  Extremities: trace LEedema, no calf pain or tenderness    A: PPD # 2, VAVD  3rd degree and cervical laceration   - Warm water sitz baths 3 times daily   - Ice packs PRN   ABL Anemia   - stable; okay hold on iron supplement until having regular bowel movements    - increase iron rich foods  Doing well - stable status  P: Routine post partum orders  Recommend f/u in 2 weeks with Dr. 09/10 to evaluate repair and EPDS  D/c home today; WOB discharge book given; plans to room in with baby until tomorrow    Billy Coast, MSN, CNM [facility]

## 2020-01-16 ENCOUNTER — Ambulatory Visit: Payer: Self-pay

## 2020-01-16 NOTE — Lactation Note (Signed)
This note was copied from a baby's chart. Lactation Consultation Note  Patient Name: Brenda Wood Date: 01/16/2020 Reason for consult: Follow-up assessment   Mother reports that she is pumping with her hand pump. Mother advised in treatment and prevention of engorgement. Mother reports that other wise she has not other questions. Mother to continue to cue base feed infant and feed at least 8-12 times or more in 24 hours and advised to allow for cluster feeding infant as needed.  Mother to continue to due STS. Mother is aware of available LC services at [facility], BFSG'S, OP Dept, and phone # for questions or concerns about breastfeeding.  Mother receptive to all teaching and plan of care.     Maternal Data    Feeding    LATCH Score                   Interventions    Lactation Tools Discussed/Used     Consult Status Consult Status: Complete    Brenda Wood 01/16/2020, 1:52 PM

## 2020-01-17 ENCOUNTER — Other Ambulatory Visit: Payer: Self-pay

## 2020-01-17 ENCOUNTER — Inpatient Hospital Stay (HOSPITAL_COMMUNITY): Payer: BC Managed Care – PPO

## 2020-01-17 ENCOUNTER — Encounter (HOSPITAL_COMMUNITY): Payer: Self-pay | Admitting: Obstetrics and Gynecology

## 2020-01-17 ENCOUNTER — Inpatient Hospital Stay (EMERGENCY_DEPARTMENT_HOSPITAL)
Admission: AD | Admit: 2020-01-17 | Discharge: 2020-01-19 | Disposition: A | Discharge status: Home / Self Care | Payer: BC Managed Care – PPO | Source: Home / Self Care | Attending: Obstetrics and Gynecology | Admitting: Obstetrics and Gynecology

## 2020-01-17 DIAGNOSIS — R0602 Shortness of breath: Secondary | ICD-10-CM

## 2020-01-17 DIAGNOSIS — O1003 Pre-existing essential hypertension complicating the puerperium: Secondary | ICD-10-CM

## 2020-01-17 DIAGNOSIS — O9081 Anemia of the puerperium: Secondary | ICD-10-CM

## 2020-01-17 DIAGNOSIS — R079 Chest pain, unspecified: Secondary | ICD-10-CM

## 2020-01-17 LAB — COMPREHENSIVE METABOLIC PANEL
ALT: 104 U/L — ABNORMAL HIGH (ref 0–44)
AST: 86 U/L — ABNORMAL HIGH (ref 15–41)
Albumin: 2.6 g/dL — ABNORMAL LOW (ref 3.5–5.0)
Alkaline Phosphatase: 99 U/L (ref 38–126)
Anion gap: 8 (ref 5–15)
BUN: 11 mg/dL (ref 6–20)
CO2: 22 mmol/L (ref 22–32)
Calcium: 8.2 mg/dL — ABNORMAL LOW (ref 8.9–10.3)
Chloride: 107 mmol/L (ref 98–111)
Creatinine, Ser: 0.83 mg/dL (ref 0.44–1.00)
GFR calc Af Amer: >60 mL/min (ref >60)
GFR calc non Af Amer: >60 mL/min (ref >60)
Glucose, Bld: 77 mg/dL (ref 70–99)
Potassium: 3.6 mmol/L (ref 3.5–5.1)
Sodium: 137 mmol/L (ref 135–145)
Total Bilirubin: 0.5 mg/dL (ref 0.3–1.2)
Total Protein: 5.5 g/dL — ABNORMAL LOW (ref 6.5–8.1)

## 2020-01-17 LAB — CBC
HCT: 24.7 % — ABNORMAL LOW (ref 36.0–46.0)
Hemoglobin: 7.7 g/dL — ABNORMAL LOW (ref 12.0–15.0)
MCH: 28.1 pg (ref 26.0–34.0)
MCHC: 31.2 g/dL (ref 30.0–36.0)
MCV: 90.1 fL (ref 80.0–100.0)
Platelets: 208 10*3/uL (ref 150–400)
RBC: 2.74 MIL/uL — ABNORMAL LOW (ref 3.87–5.11)
RDW: 15.6 % — ABNORMAL HIGH (ref 11.5–15.5)
WBC: 9 10*3/uL (ref 4.0–10.5)
nRBC: 0.3 % — ABNORMAL HIGH (ref 0.0–0.2)

## 2020-01-17 LAB — BRAIN NATRIURETIC PEPTIDE: B Natriuretic Peptide: 256 pg/mL — ABNORMAL HIGH (ref 0.0–100.0)

## 2020-01-17 MED ORDER — ALUM & MAG HYDROXIDE-SIMETH 200-200-20 MG/5ML PO SUSP
30.0000 mL | Freq: Once | ORAL | Status: AC
  Administered 2020-01-17: 30 mL via ORAL
  Filled 2020-01-17: qty 30

## 2020-01-17 MED ORDER — LIDOCAINE VISCOUS HCL 2 % MT SOLN
15.0000 mL | Freq: Once | OROMUCOSAL | Status: AC
  Administered 2020-01-17: 15 mL via ORAL
  Filled 2020-01-17: qty 15

## 2020-01-17 NOTE — MAU Note (Signed)
Brenda Wood is a 29 y.o. here in MAU reporting: s/p SVD on 01/13/20. Increased swelling in legs and feet, noticed it when she went home yesterday. Took BP at home and today 137/89. Also having shortness of breath and epigastric pressure. Feeling lightheaded at times.   Onset of complaint: yesterday  Pain score: 0/10  Vitals:   01/17/20 1822  BP: 135/65  Pulse: 90  Resp: 18  Temp: 98.8 F (37.1 C)  SpO2: 100%     Lab orders placed from triage: none

## 2020-01-17 NOTE — MAU Provider Note (Addendum)
Chief Complaint: Hypertension and Shortness of Breath   First Provider Initiated Contact with Patient 01/17/20 1926     SUBJECTIVE HPI: Brenda Wood is a 29 y.o. G2P1101 at 4 days postpartum who presents to [facility] reporting shortness of breath & chest pain. Symptoms started yesterday. States her bilateral lower legs have been swelling more than normal over the weekend. Size has been equal & no calf pain.  Has had mid chest pain that makes it difficult to take deep breaths. Also feels pain in her mid upper back. Denies fever/chills, sore throat, or cough. Nothing makes symptoms worse. States her blood pressure has been higher than normal for her at home, 130s/80s. Denies history of hypertension. No headache or visual disturbance. Yesterday had some episodes of dizziness. No syncope.   Location: chest, epigastric Quality: pressure, tightness Severity: 3/10 on pain scale Duration: 2 days Timing: constant Modifying factors: none Associated signs and symptoms: dizziness, SOB, LE swelling  Past Medical History:  Diagnosis Date  . Alpha-1-antitrypsin deficiency (HCC)    OB History  Gravida Para Term Preterm AB Living  2 2 1 1   1   SAB TAB Ectopic Multiple Live Births        0 2    # Outcome Date GA Lbr Len/2nd Weight Sex Delivery Anes PTL Lv  2 Term 01/13/20 [redacted]w[redacted]d 09:03 / 00:33 4195 g M Vag-Vacuum EPI  LIV     Birth Comments: WDL  1 Preterm 06/17/18 [redacted]w[redacted]d 07:42 / 01:05 590 g F Vag-Spont None  ND   Past Surgical History:  Procedure Laterality Date  . CERVICAL CERCLAGE N/A 07/16/2019   Procedure: 09/13/2019 CERVICAL;  Surgeon: Eldridge Abrahams, MD;  Location: [facility];  Service: Gynecology;  Laterality: N/A;  EDD: 01/20/20  01/22/20 RNFA  . WISDOM TOOTH EXTRACTION     Social History   Socioeconomic History  . Marital status: Single    Spouse name: Not on file  . Number of children: Not on file  . Years of education: Not on file  . Highest education level: Not on  file  Occupational History  . Not on file  Tobacco Use  . Smoking status: Never Smoker  . Smokeless tobacco: Never Used  Vaping Use  . Vaping Use: Never used  Substance and Sexual Activity  . Alcohol use: Never  . Drug use: Never  . Sexual activity: Yes    Birth control/protection: None  Other Topics Concern  . Not on file  Social History Narrative  . Not on file   Social Determinants of Health   Financial Resource Strain:   . Difficulty of Paying Living Expenses:   Food Insecurity:   . Worried About [name] in the Last Year:   . Programme researcher, broadcasting/film/video in the Last Year:   Transportation Needs:   . Barista (Medical):   Freight forwarder Lack of Transportation (Non-Medical):   Physical Activity:   . Days of Exercise per Week:   . Minutes of Exercise per Session:   Stress:   . Feeling of Stress :   Social Connections:   . Frequency of Communication with Friends and Family:   . Frequency of Social Gatherings with Friends and Family:   . Attends Religious Services:   . Active Member of Clubs or Organizations:   . Attends Marland Kitchen Meetings:   Banker Marital Status:   Intimate Partner Violence:   . Fear of Current or Ex-Partner:   .  Emotionally Abused:   Marland Kitchen Physically Abused:   . Sexually Abused:    Family History  Problem Relation Age of Onset  . Hypertension Father   . COPD Father   . Alpha-1 antitrypsin deficiency Father   . Diabetes Maternal Grandfather   . Diabetes Paternal Grandmother    No current facility-administered medications on file prior to encounter.   Current Outpatient Medications on File Prior to Encounter  Medication Sig Dispense Refill  . acetaminophen (TYLENOL) 325 MG tablet Take 2 tablets (650 mg total) by mouth every 4 (four) hours as needed (for pain scale < 4). 90 tablet 0  . benzocaine-Menthol (DERMOPLAST) 20-0.5 % AERO Apply 1 application topically as needed for irritation (perineal discomfort).    . cetirizine (ZYRTEC) 10 MG  tablet Take 10 mg by mouth daily as needed for allergies.     . coconut oil OIL Apply 1 application topically as needed.  0  . docusate sodium (COLACE) 100 MG capsule Take 1 capsule (100 mg total) by mouth 2 (two) times daily. 20 capsule 0  . fluticasone (FLONASE) 50 MCG/ACT nasal spray Place 1 spray into both nostrils daily.    Marland Kitchen ibuprofen (ADVIL) 600 MG tablet Take 1 tablet (600 mg total) by mouth every 6 (six) hours. 30 tablet 0  . iron polysaccharides (NIFEREX) 150 MG capsule Take 1 capsule (150 mg total) by mouth daily. 30 capsule 0  . Prenatal Vit-Fe Fumarate-FA (PRENATAL MULTIVITAMIN) TABS tablet Take 1 tablet by mouth daily at 12 noon.    . senna-docusate (SENOKOT-S) 8.6-50 MG tablet Take 2 tablets by mouth daily. 30 tablet 0   No Known Allergies  I have reviewed patient's Past Medical Hx, Surgical Hx, Family Hx, Social Hx, medications and allergies.   Review of Systems  Constitutional: Negative.   Eyes: Negative for visual disturbance.  Respiratory: Positive for chest tightness and shortness of breath. Negative for cough and wheezing.   Cardiovascular: Positive for chest pain and leg swelling. Negative for palpitations.  Gastrointestinal: Negative.   Neurological: Positive for headaches (none currently).    OBJECTIVE Patient Vitals for the past 24 hrs:  BP Temp Temp src Pulse Resp SpO2 Height Weight  01/17/20 2300 137/84 -- -- 72 -- -- -- --  01/17/20 2231 (!) 145/79 -- -- 64 -- -- -- --  01/17/20 2135 -- -- -- -- -- 99 % -- --  01/17/20 2130 138/85 -- -- 71 -- 99 % -- --  01/17/20 2125 -- -- -- -- -- 99 % -- --  01/17/20 2115 (!) 146/85 98.3 F (36.8 C) Oral 66 16 -- -- --  01/17/20 2100 (!) 142/76 -- -- 61 -- -- -- --  01/17/20 2048 -- 98.9 F (37.2 C) Oral -- 18 -- -- --  01/17/20 2045 (!) 145/86 -- -- 66 -- -- -- --  01/17/20 2030 127/75 -- -- 76 -- -- -- --  01/17/20 2025 -- -- -- -- -- 100 % -- --  01/17/20 2020 -- -- -- -- -- 100 % -- --  01/17/20 2015 (!)  144/84 -- -- 66 -- -- -- --  01/17/20 2000 139/79 -- -- 73 -- 97 % -- --  01/17/20 1950 -- -- -- -- -- 99 % -- --  01/17/20 1945 138/84 -- -- 73 -- 100 % -- --  01/17/20 1940 -- -- -- -- -- 100 % -- --  01/17/20 1935 -- -- -- -- -- 99 % -- --  01/17/20 1930 137/83 -- --  68 -- 100 % -- --  01/17/20 1925 -- -- -- -- -- 100 % -- --  01/17/20 1920 -- -- -- -- -- 100 % -- --  01/17/20 1915 133/81 -- -- 73 -- 100 % -- --  01/17/20 1914 -- -- -- -- -- 100 % -- --  01/17/20 1910 -- -- -- -- -- 100 % -- --  01/17/20 1905 -- -- -- -- -- 100 % -- --  01/17/20 1900 133/78 -- -- 70 -- 100 % -- --  01/17/20 1855 -- -- -- -- -- 100 % -- --  01/17/20 1854 132/79 -- -- 68 -- -- -- --  01/17/20 1850 -- -- -- -- -- 100 % -- --  01/17/20 1846 132/79 -- -- 72 18 -- -- --  01/17/20 1822 135/65 98.8 F (37.1 C) Oral 90 18 100 % -- --  01/17/20 1819 -- -- -- -- -- -- 5\' 3"  (1.6 m) 83.5 kg   Constitutional: Well-developed, well-nourished female in no acute distress.  Cardiovascular: normal rate & rhythm, no murmur Respiratory: normal rate and effort. Lung sounds clear throughout GI: Abd soft, non-tender, Pos BS x 4. No guarding or rebound tenderness MS: BLE - 2+ pitting edema. Extremities nontender, normal ROM Neurologic: Alert and oriented x 4.     LAB RESULTS Results for orders placed or performed during the hospital encounter of 01/17/20 (from the past 24 hour(s))  CBC     Status: Abnormal   Collection Time: 01/17/20  7:15 PM  Result Value Ref Range   WBC 9.0 4.0 - 10.5 K/uL   RBC 2.74 (L) 3.87 - 5.11 MIL/uL   Hemoglobin 7.7 (L) 12.0 - 15.0 g/dL   HCT 03/19/20 (L) 36 - 46 %   MCV 90.1 80.0 - 100.0 fL   MCH 28.1 26.0 - 34.0 pg   MCHC 31.2 30.0 - 36.0 g/dL   RDW 56.3 (H) 14.9 - 70.2 %   Platelets 208 150 - 400 K/uL   nRBC 0.3 (H) 0.0 - 0.2 %  Comprehensive metabolic panel     Status: Abnormal   Collection Time: 01/17/20  7:15 PM  Result Value Ref Range   Sodium 137 135 - 145 mmol/L   Potassium  3.6 3.5 - 5.1 mmol/L   Chloride 107 98 - 111 mmol/L   CO2 22 22 - 32 mmol/L   Glucose, Bld 77 70 - 99 mg/dL   BUN 11 6 - 20 mg/dL   Creatinine, Ser 03/19/20 0.44 - 1.00 mg/dL   Calcium 8.2 (L) 8.9 - 10.3 mg/dL   Total Protein 5.5 (L) 6.5 - 8.1 g/dL   Albumin 2.6 (L) 3.5 - 5.0 g/dL   AST 86 (H) 15 - 41 U/L   ALT 104 (H) 0 - 44 U/L   Alkaline Phosphatase 99 38 - 126 U/L   Total Bilirubin 0.5 0.3 - 1.2 mg/dL   GFR calc non Af Amer >60 >60 mL/min   GFR calc Af Amer >60 >60 mL/min   Anion gap 8 5 - 15  Brain natriuretic peptide     Status: Abnormal   Collection Time: 01/17/20  7:15 PM  Result Value Ref Range   B Natriuretic Peptide 256.0 (H) 0.0 - 100.0 pg/mL    IMAGING DG CHEST PORT 1 VIEW  Result Date: 01/17/2020 CLINICAL DATA:  29 year old female with shortness of breath. EXAM: PORTABLE CHEST 1 VIEW COMPARISON:  None. FINDINGS: The cardiomediastinal silhouette is unremarkable. There is no evidence of focal  airspace disease, pulmonary edema, suspicious pulmonary nodule/mass, pleural effusion, or pneumothorax. No acute bony abnormalities are identified. IMPRESSION: No active disease. Electronically Signed   By: Harmon Pier M.D.   On: 01/17/2020 20:52    MAU COURSE Orders Placed This Encounter  Procedures  . SARS CORONAVIRUS 2 (TAT 6-24 HRS) Nasopharyngeal Nasopharyngeal Swab  . DG CHEST PORT 1 VIEW  . CBC  . Comprehensive metabolic panel  . Brain natriuretic peptide  . Airborne and Contact precautions  . ED EKG   Meds ordered this encounter  Medications  . AND Linked Order Group   . alum & mag hydroxide-simeth (MAALOX/MYLANTA) 200-200-20 MG/5ML suspension 30 mL   . lidocaine (XYLOCAINE) 2 % viscous mouth solution 15 mL    MDM No severe range blood pressures in MAU. Currently no headache.  EKG normal Lung sounds clear & spO2 100% CBC, BNP, CMP pending Covid swab & chest xray ordered  Will give GI cocktail due to quick results, if doesn't improve symptoms will give tylenol  & flexeril  Care turned over to [facility] Judeth Horn, NP 01/17/2020  8:00 PM  *Reassessment @ 2100: Epigastric pain and H/A resolved after G.I. Cocktail. Will await lab results and consult with Dr. Vergie Living.  *Consult with Dr. Vergie Living @ 2228 - notified of patient's complaints, assessments, lab & CXR results, recommended tx plan admit to [facility] and start MgSO4 x 24 hrs  Patient updated @ 2235 on Dr. Vergie Living' recommendation and will call Dr. Billy Coast. Will update patient after consulting with Dr. Billy Coast. Patient verbalized an understanding.   *Dr. Billy Coast notified @ 2310 of patient's complaints, assessments, lab, CXR results, and Dr. Vergie Living' admission and MgSO4 recommendation. Dr. Billy Coast agrees with admission. Verbal orders received to admit to OBSCU, give Feraheme infusion for anemia, repeat LFTs @ 0730, and hold on MgSO4 infusion. Dr. Billy Coast to assume care of patient upon admission to [facility]. He will come see the patient in the morning after LFTs are resulted.  *Patient updated on admission plan @ 2320, Patient verbalized an understanding of the plan of care and agrees.   Assessment & Plan 1. Benign essential hypertension, postpartum 2. Postpartum anemia, baby delivered during previous episode of care - OBSCU Routine Admission Orders - CMP @ 0730 - IVF: 0.9% NS by IV @ 125 ml/hr - Feraheme infusion x 1 per protocol   Raelyn Mora, CNM  01/17/2020 11:39 PM

## 2020-01-18 ENCOUNTER — Encounter (HOSPITAL_COMMUNITY): Payer: Self-pay | Admitting: Obstetrics and Gynecology

## 2020-01-18 DIAGNOSIS — O1003 Pre-existing essential hypertension complicating the puerperium: Secondary | ICD-10-CM | POA: Diagnosis present

## 2020-01-18 LAB — COMPREHENSIVE METABOLIC PANEL
ALT: 121 U/L — ABNORMAL HIGH (ref 0–44)
AST: 92 U/L — ABNORMAL HIGH (ref 15–41)
Albumin: 2.4 g/dL — ABNORMAL LOW (ref 3.5–5.0)
Alkaline Phosphatase: 94 U/L (ref 38–126)
Anion gap: 9 (ref 5–15)
BUN: 12 mg/dL (ref 6–20)
CO2: 21 mmol/L — ABNORMAL LOW (ref 22–32)
Calcium: 8.1 mg/dL — ABNORMAL LOW (ref 8.9–10.3)
Chloride: 110 mmol/L (ref 98–111)
Creatinine, Ser: 0.8 mg/dL (ref 0.44–1.00)
GFR calc Af Amer: >60 mL/min (ref >60)
GFR calc non Af Amer: >60 mL/min (ref >60)
Glucose, Bld: 87 mg/dL (ref 70–99)
Potassium: 3.8 mmol/L (ref 3.5–5.1)
Sodium: 140 mmol/L (ref 135–145)
Total Bilirubin: 0.5 mg/dL (ref 0.3–1.2)
Total Protein: 5.1 g/dL — ABNORMAL LOW (ref 6.5–8.1)

## 2020-01-18 LAB — SARS CORONAVIRUS 2 (TAT 6-24 HRS): SARS Coronavirus 2: NEGATIVE

## 2020-01-18 LAB — TYPE AND SCREEN
ABO/RH(D): O POS
Antibody Screen: NEGATIVE

## 2020-01-18 MED ORDER — LABETALOL HCL 5 MG/ML IV SOLN: 80.0000 mg | INTRAVENOUS | Status: DC | PRN

## 2020-01-18 MED ORDER — MAGNESIUM SULFATE 40 GM/1000ML IV SOLN
2.0000 g/h | INTRAVENOUS | Status: DC
  Administered 2020-01-18 – 2020-01-19 (×2): 2 g/h via INTRAVENOUS
  Filled 2020-01-18 (×2): qty 1000

## 2020-01-18 MED ORDER — IBUPROFEN 600 MG PO TABS
600.0000 mg | ORAL_TABLET | Freq: Four times a day (QID) | ORAL | Status: DC | PRN
  Administered 2020-01-18 (×2): 600 mg via ORAL
  Filled 2020-01-18 (×2): qty 1

## 2020-01-18 MED ORDER — LACTATED RINGERS IV SOLN: INTRAVENOUS | Status: DC

## 2020-01-18 MED ORDER — LABETALOL HCL 5 MG/ML IV SOLN: 40.0000 mg | INTRAVENOUS | Status: DC | PRN

## 2020-01-18 MED ORDER — BENZOCAINE-MENTHOL 20-0.5 % EX AERO
1.0000 "application " | INHALATION_SPRAY | Freq: Four times a day (QID) | CUTANEOUS | Status: DC | PRN
  Administered 2020-01-18: 1 via TOPICAL
  Filled 2020-01-18: qty 56

## 2020-01-18 MED ORDER — DOCUSATE SODIUM 100 MG PO CAPS
100.0000 mg | ORAL_CAPSULE | Freq: Every day | ORAL | Status: DC | PRN
  Administered 2020-01-18 (×2): 100 mg via ORAL
  Filled 2020-01-18 (×2): qty 1

## 2020-01-18 MED ORDER — SODIUM CHLORIDE 0.9 % IV SOLN
510.0000 mg | Freq: Once | INTRAVENOUS | Status: AC
  Administered 2020-01-18: 510 mg via INTRAVENOUS
  Filled 2020-01-18: qty 17

## 2020-01-18 MED ORDER — LABETALOL HCL 5 MG/ML IV SOLN: 20.0000 mg | INTRAVENOUS | Status: DC | PRN

## 2020-01-18 MED ORDER — MAGNESIUM SULFATE BOLUS VIA INFUSION
4.0000 g | Freq: Once | INTRAVENOUS | Status: AC
  Administered 2020-01-18: 4 g via INTRAVENOUS
  Filled 2020-01-18: qty 1000

## 2020-01-18 MED ORDER — HYDRALAZINE HCL 20 MG/ML IJ SOLN: 10.0000 mg | INTRAMUSCULAR | Status: DC | PRN

## 2020-01-18 MED ORDER — SODIUM CHLORIDE 0.9 % IV SOLN: INTRAVENOUS | Status: DC

## 2020-01-18 NOTE — Progress Notes (Signed)
HD 1 S:  Feeling better. No CP or SOB. Denies HA. No change in vaginal bleeding O: BP 138/81   Pulse 70   Temp 98.9 F (37.2 C) (Oral)   Resp 17   Ht 5\' 3"  (1.6 m)   Wt 83.5 kg   SpO2 97%   BMI 32.59 kg/m   HEENT: nl  Neck: Supple with FROM Lungs: CTA CV: RRR Abd: soft , NT, No RUQ tenderness VE deferred Neuro : non focal SKin intact  CMP Latest Ref Rng & Units 01/18/2020 01/17/2020  Glucose 70 - 99 mg/dL 87 77  BUN 6 - 20 mg/dL 12 11  Creatinine 03/19/2020 - 1.00 mg/dL 2.06 0.15  Sodium 6.15 - 145 mmol/L 140 137  Potassium 3.5 - 5.1 mmol/L 3.8 3.6  Chloride 98 - 111 mmol/L 110 107  CO2 22 - 32 mmol/L 21(L) 22  Calcium 8.9 - 10.3 mg/dL 8.1(L) 8.2(L)  Total Protein 6.5 - 8.1 g/dL 5.1(L) 5.5(L)  Total Bilirubin 0.3 - 1.2 mg/dL 0.5 0.5  Alkaline Phos 38 - 126 U/L 94 99  AST 15 - 41 U/L 92(H) 86(H)  ALT 0 - 44 U/L 121(H) 104(H)   IMP: PP HTN (mild) now with inc AST/ALT. Most likely dx PP PEC. Doubt Alpha 1 AAT deficiency. Will Mag x 24 hrs. Rpt labs in am. Plan discussed with pt.

## 2020-01-18 NOTE — Progress Notes (Signed)
No complaints No headache, CP or SOB BP 135/67 (BP Location: Right Arm)   Pulse 94   Temp 97.8 F (36.6 C) (Oral)   Resp 18   Ht 5\' 3"  (1.6 m)   Wt 83.5 kg   SpO2 98%   BMI 32.59 kg/m  Continue Magnesium x 24hrs Rpt CMP in am

## 2020-01-18 NOTE — MAU Provider Note (Addendum)
HISTORY AND PHYSICAL EXAMINATION  Chief Complaint: Hypertension and Shortness of Breath (130s/80s at home)     SUBJECTIVE HPI: Brenda Wood is a 29 y.o. G2P1101 at 4 days postpartum who presents to Maternity Admissions reporting shortness of breath & chest pain. Symptoms started yesterday. States her bilateral lower legs have been swelling more than normal over the weekend. Size has been equal & no calf pain.  Has had mid chest pain that makes it difficult to take deep breaths. Also feels pain in her mid upper back. Denies fever/chills, sore throat, or cough. Nothing makes symptoms worse. States her blood pressure has been higher than normal for her at home, 130s/80s. Denies history of pregnancy or non pregnant  hypertension. No headache or visual disturbance. Yesterday had some episodes of dizziness. No syncope.   Location: chest, epigastric Quality: pressure, tightness Severity: 3/10 on pain scale Duration: 2 days Timing: constant Modifying factors: none Associated signs and symptoms: dizziness, SOB, LE swelling  Past Medical History:  Diagnosis Date  . Alpha-1-antitrypsin deficiency (HCC)    OB History  Gravida Para Term Preterm AB Living  SAB TAB Ectopic Multiple Live Births        0 2    # Outcome Date GA Lbr Len/2nd Weight Sex Delivery Anes PTL Lv  2 Term 01/13/20 [redacted]w[redacted]d 09:03 / 00:33 4195 g M Vag-Vacuum EPI  LIV     Birth Comments: WDL  1 Preterm 06/17/18 [redacted]w[redacted]d 07:42 / 01:05 590 g F Vag-Spont None  ND   Past Surgical History:  Procedure Laterality Date  . CERVICAL CERCLAGE N/A 07/16/2019   Procedure: Eldridge Abrahams CERVICAL;  Surgeon: Olivia Mackie, MD;  Location: [facility] LD [facility]S;  Service: Gynecology;  Laterality: N/A;  EDD: 01/20/20  [name] RNFA  . WISDOM TOOTH EXTRACTION     Social History   Socioeconomic History  . Marital status: Single    Spouse name: Not on file  . Number of children: Not on file  . Years of education: Not on file  . Highest  education level: Not on file  Occupational History  . Not on file  Tobacco Use  . Smoking status: Never Smoker  . Smokeless tobacco: Never Used  Vaping Use  . Vaping Use: Never used  Substance and Sexual Activity  . Alcohol use: Never  . Drug use: Never  . Sexual activity: Yes    Birth control/protection: None  Other Topics Concern  . Not on file  Social History Narrative  . Not on file   Social Determinants of Health   Financial Resource Strain:   . Difficulty of Paying Living Expenses:   Food Insecurity:   . Worried About Programme researcher, broadcasting/film/video in the Last Year:   . Barista in the Last Year:   Transportation Needs:   . Freight forwarder (Medical):   Marland Kitchen Lack of Transportation (Non-Medical):   Physical Activity:   . Days of Exercise per Week:   . Minutes of Exercise per Session:   Stress:   . Feeling of Stress :   Social Connections:   . Frequency of Communication with Friends and Family:   . Frequency of Social Gatherings with Friends and Family:   . Attends Religious Services:   . Active Member of Clubs or Organizations:   . Attends Banker Meetings:   Marland Kitchen Marital Status:   Intimate Partner Violence:   . Fear of Current or Ex-Partner:   .  Emotionally Abused:   Marland Kitchen Physically Abused:   . Sexually Abused:    Family History  Problem Relation Age of Onset  . Hypertension Father   . COPD Father   . Alpha-1 antitrypsin deficiency Father   . Diabetes Maternal Grandfather   . Hypertension Maternal Grandfather   . Diabetes Paternal Grandmother   . Hypertension Paternal Grandmother   . Hypertension Mother    No current facility-administered medications on file prior to encounter.   Current Outpatient Medications on File Prior to Encounter  Medication Sig Dispense Refill  . acetaminophen (TYLENOL) 325 MG tablet Take 2 tablets (650 mg total) by mouth every 4 (four) hours as needed (for pain scale < 4). 90 tablet 0  . benzocaine-Menthol  (DERMOPLAST) 20-0.5 % AERO Apply 1 application topically as needed for irritation (perineal discomfort).    . cetirizine (ZYRTEC) 10 MG tablet Take 10 mg by mouth daily as needed for allergies.     . coconut oil OIL Apply 1 application topically as needed.  0  . docusate sodium (COLACE) 100 MG capsule Take 1 capsule (100 mg total) by mouth 2 (two) times daily. 20 capsule 0  . fluticasone (FLONASE) 50 MCG/ACT nasal spray Place 1 spray into both nostrils daily.    Marland Kitchen ibuprofen (ADVIL) 600 MG tablet Take 1 tablet (600 mg total) by mouth every 6 (six) hours. 30 tablet 0  . iron polysaccharides (NIFEREX) 150 MG capsule Take 1 capsule (150 mg total) by mouth daily. 30 capsule 0  . Prenatal Vit-Fe Fumarate-FA (PRENATAL MULTIVITAMIN) TABS tablet Take 1 tablet by mouth daily at 12 noon.    . senna-docusate (SENOKOT-S) 8.6-50 MG tablet Take 2 tablets by mouth daily. 30 tablet 0   No Known Allergies  I have reviewed patient's Past Medical Hx, Surgical Hx, Family Hx, Social Hx, medications and allergies.   Review of Systems  Constitutional: Negative.   HENT: Negative.   Eyes: Negative for visual disturbance.  Respiratory: Positive for chest tightness and shortness of breath. Negative for cough and wheezing.   Cardiovascular: Positive for chest pain and leg swelling. Negative for palpitations.  Gastrointestinal: Negative.   Musculoskeletal: Negative.   Neurological: Negative.  Headaches: none currently.  All other systems reviewed and are negative.   OBJECTIVE Patient Vitals for the past 24 hrs:  BP Temp Temp src Pulse Resp SpO2 Height Weight  01/18/20 0433 (!) 137/97 99.6 F (37.6 C) Oral 69 16 96 % -- --  01/18/20 0021 134/77 98.3 F (36.8 C) Oral 69 20 99 % -- --  01/17/20 2325 -- -- -- -- -- 100 % -- --  01/17/20 2320 -- -- -- -- -- 100 % -- --  01/17/20 2315 -- -- -- -- -- 100 % -- --  01/17/20 2310 -- -- -- -- -- 100 % -- --  01/17/20 2300 137/84 -- -- 72 -- -- -- --  01/17/20 2231 (!)  145/79 -- -- 64 -- -- -- --  01/17/20 2135 -- -- -- -- -- 99 % -- --  01/17/20 2130 138/85 -- -- 71 -- 99 % -- --  01/17/20 2125 -- -- -- -- -- 99 % -- --  01/17/20 2115 (!) 146/85 98.3 F (36.8 C) Oral 66 16 -- -- --  01/17/20 2100 (!) 142/76 -- -- 61 -- -- -- --  01/17/20 2048 -- 98.9 F (37.2 C) Oral -- 18 -- -- --  01/17/20 2045 (!) 145/86 -- -- 66 -- -- -- --  01/17/20 2030 127/75 -- -- 76 -- -- -- --  01/17/20 2025 -- -- -- -- -- 100 % -- --  01/17/20 2020 -- -- -- -- -- 100 % -- --  01/17/20 2015 (!) 144/84 -- -- 66 -- -- -- --  01/17/20 2000 139/79 -- -- 73 -- 97 % -- --  01/17/20 1950 -- -- -- -- -- 99 % -- --  01/17/20 1945 138/84 -- -- 73 -- 100 % -- --  01/17/20 1940 -- -- -- -- -- 100 % -- --  01/17/20 1935 -- -- -- -- -- 99 % -- --  01/17/20 1930 137/83 -- -- 68 -- 100 % -- --  01/17/20 1925 -- -- -- -- -- 100 % -- --  01/17/20 1920 -- -- -- -- -- 100 % -- --  01/17/20 1915 133/81 -- -- 73 -- 100 % -- --  01/17/20 1914 -- -- -- -- -- 100 % -- --  01/17/20 1910 -- -- -- -- -- 100 % -- --  01/17/20 1905 -- -- -- -- -- 100 % -- --  01/17/20 1900 133/78 -- -- 70 -- 100 % -- --  01/17/20 1855 -- -- -- -- -- 100 % -- --  01/17/20 1854 132/79 -- -- 68 -- -- -- --  01/17/20 1850 -- -- -- -- -- 100 % -- --  01/17/20 1846 132/79 -- -- 72 18 -- -- --  01/17/20 1822 135/65 98.8 F (37.1 C) Oral 90 18 100 % -- --  01/17/20 1819 -- -- -- -- -- -- 5\' 3"  (1.6 m) 83.5 kg   Constitutional: Well-developed, well-nourished female in no acute distress.  NCAT Neck: supple Cardiovascular: normal rate & rhythm, no murmur Respiratory: normal rate and effort. Lung sounds clear throughout GI: Abd soft, non-tender, Pos BS x 4. No guarding or rebound tenderness MS: BLE - 2+ pitting edema. Extremities nontender, normal ROM Neurologic: Alert and oriented x 4. No CVAT Skin intact  VE: deferred    LAB RESULTS Results for orders placed or performed during the hospital encounter of  01/17/20 (from the past 24 hour(s))  CBC     Status: Abnormal   Collection Time: 01/17/20  7:15 PM  Result Value Ref Range   WBC 9.0 4.0 - 10.5 K/uL   RBC 2.74 (L) 3.87 - 5.11 MIL/uL   Hemoglobin 7.7 (L) 12.0 - 15.0 g/dL   HCT 03/19/20 (L) 36 - 46 %   MCV 90.1 80.0 - 100.0 fL   MCH 28.1 26.0 - 34.0 pg   MCHC 31.2 30.0 - 36.0 g/dL   RDW 82.4 (H) 23.5 - 36.1 %   Platelets 208 150 - 400 K/uL   nRBC 0.3 (H) 0.0 - 0.2 %  Comprehensive metabolic panel     Status: Abnormal   Collection Time: 01/17/20  7:15 PM  Result Value Ref Range   Sodium 137 135 - 145 mmol/L   Potassium 3.6 3.5 - 5.1 mmol/L   Chloride 107 98 - 111 mmol/L   CO2 22 22 - 32 mmol/L   Glucose, Bld 77 70 - 99 mg/dL   BUN 11 6 - 20 mg/dL   Creatinine, Ser 03/19/20 0.44 - 1.00 mg/dL   Calcium 8.2 (L) 8.9 - 10.3 mg/dL   Total Protein 5.5 (L) 6.5 - 8.1 g/dL   Albumin 2.6 (L) 3.5 - 5.0 g/dL   AST 86 (H) 15 - 41 U/L   ALT 104 (H) 0 - 44 U/L  Alkaline Phosphatase 99 38 - 126 U/L   Total Bilirubin 0.5 0.3 - 1.2 mg/dL   GFR calc non Af Amer >60 >60 mL/min   GFR calc Af Amer >60 >60 mL/min   Anion gap 8 5 - 15  Brain natriuretic peptide     Status: Abnormal   Collection Time: 01/17/20  7:15 PM  Result Value Ref Range   B Natriuretic Peptide 256.0 (H) 0.0 - 100.0 pg/mL  SARS CORONAVIRUS 2 (TAT 6-24 HRS) Nasopharyngeal Nasopharyngeal Swab     Status: None   Collection Time: 01/17/20  7:59 PM   Specimen: Nasopharyngeal Swab  Result Value Ref Range   SARS Coronavirus 2 NEGATIVE NEGATIVE  Type and screen [facility]     Status: None   Collection Time: 01/17/20 11:38 PM  Result Value Ref Range   ABO/RH(D) O POS    Antibody Screen NEG    Sample Expiration      01/20/2020,2359 Performed at [facility], [address] [place], [place] [address]     IMAGING DG CHEST PORT 1 VIEW  Result Date: 01/17/2020 CLINICAL DATA:  29 year old female with shortness of breath. EXAM: PORTABLE CHEST 1 VIEW COMPARISON:   None. FINDINGS: The cardiomediastinal silhouette is unremarkable. There is no evidence of focal airspace disease, pulmonary edema, suspicious pulmonary nodule/mass, pleural effusion, or pneumothorax. No acute bony abnormalities are identified. IMPRESSION: No active disease. Electronically Signed   By: Harmon Pier M.D.   On: 01/17/2020 20:52    MAU COURSE Orders Placed This Encounter  Procedures  . SARS CORONAVIRUS 2 (TAT 6-24 HRS) Nasopharyngeal Nasopharyngeal Swab  . DG CHEST PORT 1 VIEW  . CBC  . Comprehensive metabolic panel  . Brain natriuretic peptide  . Comprehensive metabolic panel  . Diet regular Room service appropriate? Yes; Fluid consistency: Thin  . Notify Physician  . Initiate Oral Care Protocol  . Initiate Carrier Fluid Protocol  . Measure blood pressure  . Full code  . ED EKG  . Type and screen [facility]  . Place in observation (patient's expected length of stay will be less than 2 midnights)   Meds ordered this encounter  Medications  . AND Linked Order Group   . alum & mag hydroxide-simeth (MAALOX/MYLANTA) 200-200-20 MG/5ML suspension 30 mL   . lidocaine (XYLOCAINE) 2 % viscous mouth solution 15 mL  . AND Linked Order Group   . labetalol (NORMODYNE) injection 20 mg   . labetalol (NORMODYNE) injection 40 mg   . labetalol (NORMODYNE) injection 80 mg   . hydrALAZINE (APRESOLINE) injection 10 mg  . ferumoxytol (FERAHEME) 510 mg in sodium chloride 0.9 % 100 mL IVPB  . 0.9 %  sodium chloride infusion  . docusate sodium (COLACE) capsule 100 mg  . ibuprofen (ADVIL) tablet 600 mg  . benzocaine-Menthol (DERMOPLAST) 20-0.5 % topical spray 1 application    MDM No severe range blood pressures in MAU. No elevated BPs during presentation Pt frustrated with long time course in MAU. Mild elevated 'BPs noted after 2-3 hrs with inc maternal anxiety noted. Mildly elevate BPs noted after all symptoms resolved with GI cocktail? EKG normal Lung sounds clear &  spO2 100% CBC, BNP, CMP pending Covid swab & chest xray ordered   Assessment & Plan 1. Benign essential hypertension, postpartum, 2x AST/ALT elevation with no symptoms of PEC. ? Atypical  Presentation.  2. Postpartum anemia, baby delivered during previous episode of care. No active bleeding. 3. Alpha 1 Antitrypsin  deficiency- no evidence of pulmonary disease on CXR (no history of pulmonary disease), no history of hepatic disease  - OBSCU Routine Admission Orders for overnight observation and rpt labs, serial BPs - CMP @ 0730 - IVF: 0.9% NS by IV @ 125 ml/hr - Feraheme infusion x 1 per protocol Pt desires DC home. Discussed plan of observation but agreed to hold Magnesium at this time due to atypical presentation.  Lenoard Aden, MD  01/18/2020 6:45 AM

## 2020-01-18 NOTE — Plan of Care (Signed)
Patient is a re admit for chest pain.SOB and epigastric pain.Chest X-ray and COVID swab are negative.Vital signs are normal at this time and DTR's are normal.Patient stated epigastric pain is a little better. Plan of care discussed with patient and oriented to room and routine done

## 2020-01-19 ENCOUNTER — Other Ambulatory Visit (HOSPITAL_COMMUNITY): Payer: Self-pay | Admitting: Obstetrics and Gynecology

## 2020-01-19 ENCOUNTER — Other Ambulatory Visit: Payer: Self-pay | Admitting: Obstetrics and Gynecology

## 2020-01-19 DIAGNOSIS — R7989 Other specified abnormal findings of blood chemistry: Secondary | ICD-10-CM

## 2020-01-19 LAB — COMPREHENSIVE METABOLIC PANEL
ALT: 236 U/L — ABNORMAL HIGH (ref 0–44)
AST: 161 U/L — ABNORMAL HIGH (ref 15–41)
Albumin: 2.8 g/dL — ABNORMAL LOW (ref 3.5–5.0)
Alkaline Phosphatase: 126 U/L (ref 38–126)
Anion gap: 8 (ref 5–15)
BUN: 7 mg/dL (ref 6–20)
CO2: 26 mmol/L (ref 22–32)
Calcium: 7.3 mg/dL — ABNORMAL LOW (ref 8.9–10.3)
Chloride: 106 mmol/L (ref 98–111)
Creatinine, Ser: 0.79 mg/dL (ref 0.44–1.00)
GFR calc Af Amer: >60 mL/min (ref >60)
GFR calc non Af Amer: >60 mL/min (ref >60)
Glucose, Bld: 81 mg/dL (ref 70–99)
Potassium: 3.6 mmol/L (ref 3.5–5.1)
Sodium: 140 mmol/L (ref 135–145)
Total Bilirubin: 0.3 mg/dL (ref 0.3–1.2)
Total Protein: 6 g/dL — ABNORMAL LOW (ref 6.5–8.1)

## 2020-01-19 MED ORDER — ACETAMINOPHEN 500 MG PO TABS
1000.0000 mg | ORAL_TABLET | Freq: Four times a day (QID) | ORAL | Status: DC | PRN
  Administered 2020-01-19: 1000 mg via ORAL
  Filled 2020-01-19: qty 2

## 2020-01-19 MED ORDER — FLUTICASONE PROPIONATE 50 MCG/ACT NA SUSP
1.0000 | Freq: Every day | NASAL | Status: DC
  Administered 2020-01-19: 1 via NASAL
  Filled 2020-01-19: qty 16

## 2020-01-19 MED ORDER — LORATADINE 10 MG PO TABS
10.0000 mg | ORAL_TABLET | Freq: Every day | ORAL | Status: DC
  Administered 2020-01-19: 10 mg via ORAL
  Filled 2020-01-19: qty 1

## 2020-01-19 NOTE — Plan of Care (Signed)
Patient being discharge with printed instructions. .me

## 2020-01-19 NOTE — Progress Notes (Addendum)
HD 2 PP PEC S:  Feeling better. No CP or SOB. Denies HA. No change in vaginal bleeding O: BP 125/88 (BP Location: Left Arm)   Pulse 70   Temp 97.9 F (36.6 C) (Oral)   Resp 20   Ht 5\' 3"  (1.6 m)   Wt 83.5 kg   SpO2 99%   BMI 32.59 kg/m   HEENT: nl  Neck: Supple with FROM Lungs: CTA CV: RRR Abd: soft , NT, No RUQ tenderness VE deferred Neuro : non focal SKin intact  CMP Latest Ref Rng & Units 01/19/2020 01/18/2020 01/17/2020  Glucose 70 - 99 mg/dL 81 87 77  BUN 6 - 20 mg/dL 7 12 11   Creatinine 0.44 - 1.00 mg/dL 03/19/2020 4.81  Sodium 135 - 145 mmol/L 140 140 137  Potassium 3.5 - 5.1 mmol/L 3.6 3.8 3.6  Chloride 98 - 111 mmol/L 106 110 107  CO2 22 - 32 mmol/L 26 21(L) 22  Calcium 8.9 - 10.3 mg/dL 7.3(L) 8.1(L) 8.2(L)  Total Protein 6.5 - 8.1 g/dL 6.0(L) 5.1(L) 5.5(L)  Total Bilirubin 0.3 - 1.2 mg/dL 0.3 0.5 0.5  Alkaline Phos 38 - 126 U/L 126 94 99  AST 15 - 41 U/L 161(H) 92(H) 86(H)  ALT 0 - 44 U/L 236(H) 121(H) 104(H)    IMP: PP HTN (mild) resolved. Good diuresis. Asymptomatic s/p Mag sulfate x 24hr. AST/ALT elevation- continued elevation, ? Etx. ?AAT Discussed with MFM, recommends full hepatic panel and RUQ ultrasound. Pt declines continued inpt management. Outpt sono scheduled Rpt labs in office in am.

## 2020-01-20 ENCOUNTER — Ambulatory Visit (HOSPITAL_COMMUNITY)
Admission: RE | Admit: 2020-01-20 | Discharge: 2020-01-20 | Disposition: A | Discharge status: Home / Self Care | Payer: BC Managed Care – PPO | Source: Ambulatory Visit | Attending: Obstetrics and Gynecology | Admitting: Obstetrics and Gynecology

## 2020-01-20 ENCOUNTER — Other Ambulatory Visit: Payer: Self-pay

## 2020-01-20 DIAGNOSIS — R945 Abnormal results of liver function studies: Secondary | ICD-10-CM | POA: Diagnosis not present

## 2020-01-20 DIAGNOSIS — R7989 Other specified abnormal findings of blood chemistry: Secondary | ICD-10-CM

## 2020-01-23 NOTE — Discharge Summary (Signed)
Brenda Wood, Brenda Wood MEDICAL RECORD [id number] ACCOUNT [id number] DATE OF BIRTH:Jul 13, 1990 FACILITY: [facility] LOCATION: [facility] PHYSICIAN:Stacie Knutzen J. Billy Coast, MD  DISCHARGE SUMMARY  DATE OF DISCHARGE:  01/19/2020  ADMISSION DIAGNOSIS:  Postpartum preeclampsia.  DISCHARGE DIAGNOSIS:  Postpartum preeclampsia.  HOSPITAL COURSE:  The patient was admitted with elevation of liver function tests and mildly elevated pressures 4 days postpartum.  She was administered  magnesium.  Blood pressure stabilized.  She was discharged to home after 24 hours of magnesium  sulfate prophylaxis.  She is to follow up in the office in 1 week for blood pressure check and serial lab monitoring.  Discharge teaching was done.  DISCHARGE MEDICATIONS:  Prenatal vitamins and iron.  DISCHARGE CONDITION:  Good.  VN/NUANCE D:01/23/2020 T:01/23/2020 JOB:[id number]

## 2021-06-30 IMAGING — US US ABDOMEN LIMITED
1 series · 14 of 25 positions shown · non-contrast
Comparison: None.

CLINICAL DATA: Abnormal liver function tests.

EXAM:
ULTRASOUND ABDOMEN LIMITED RIGHT UPPER QUADRANT

[Series 1: us abdomen limited · 14 of 59 slices shown]
[im 1/59]
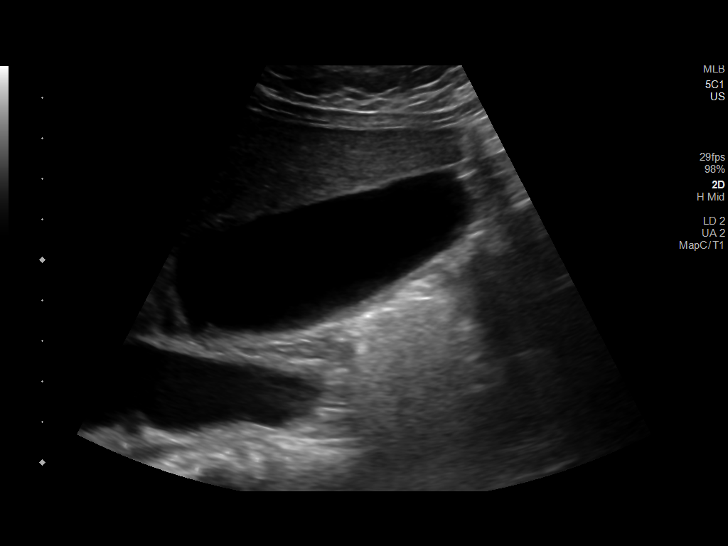
[im 5/59]
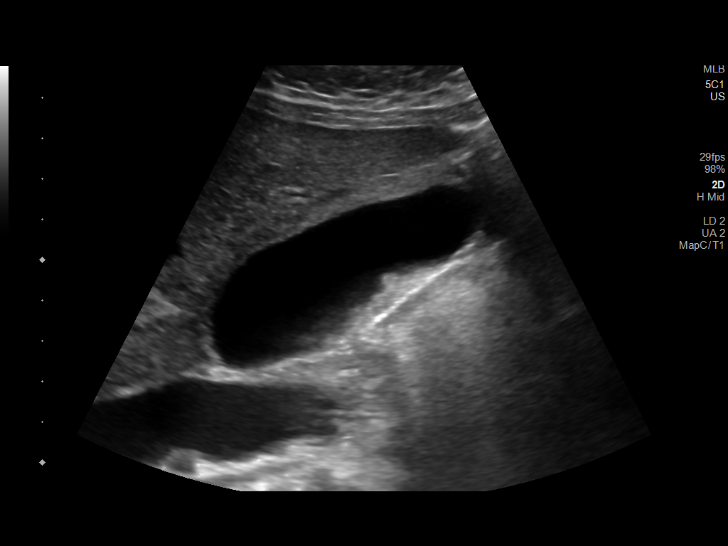
[im 10/59]
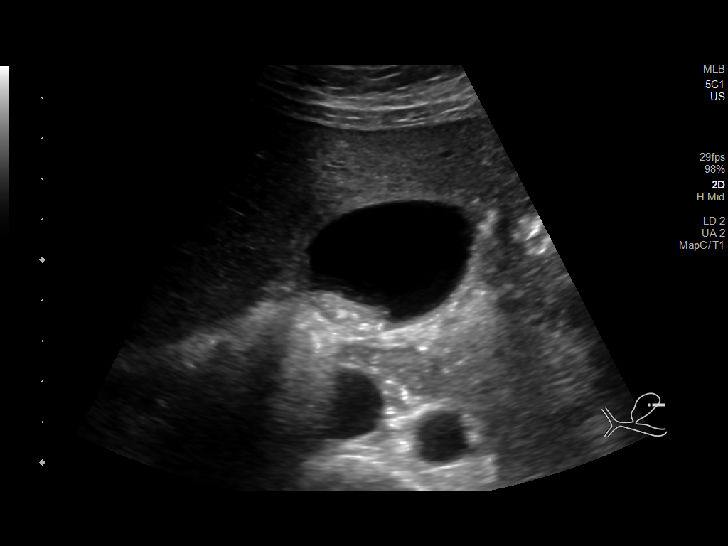
[im 15/59]
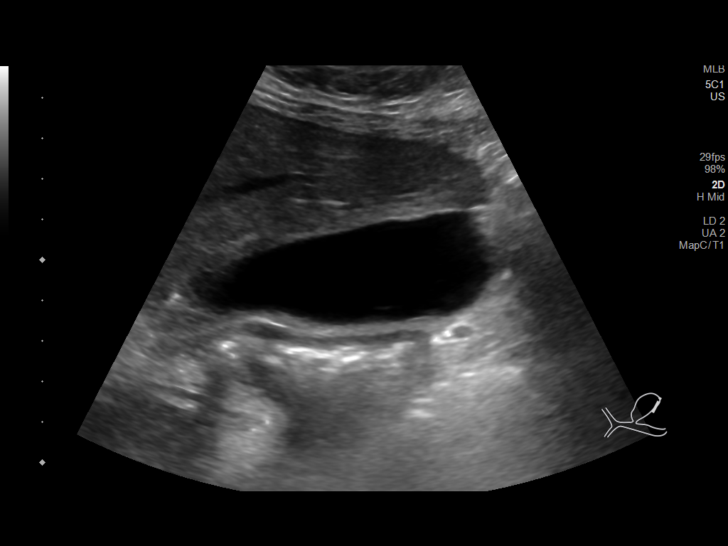
[im 20/59]
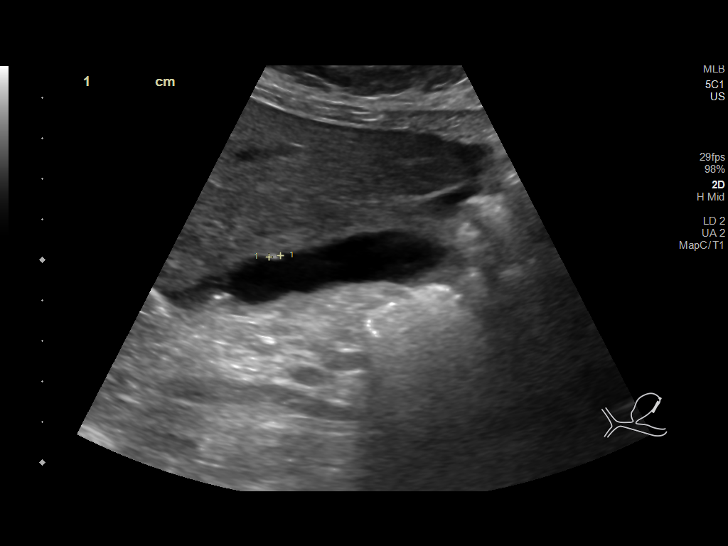
[im 22/59]
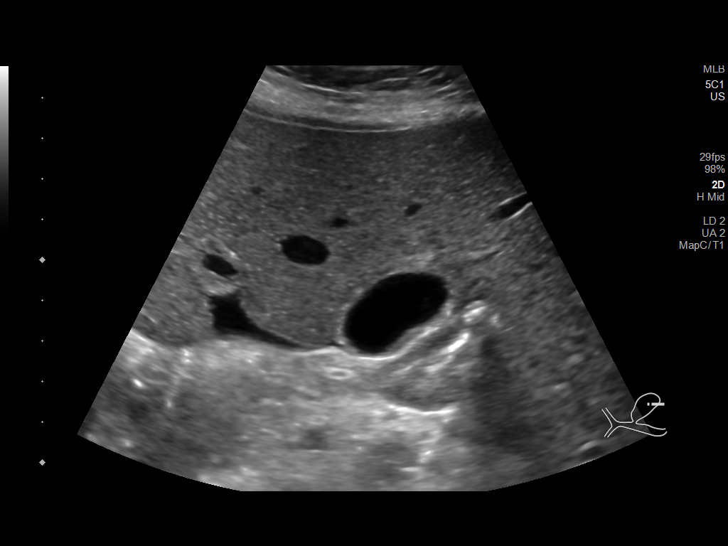
[im 27/59]
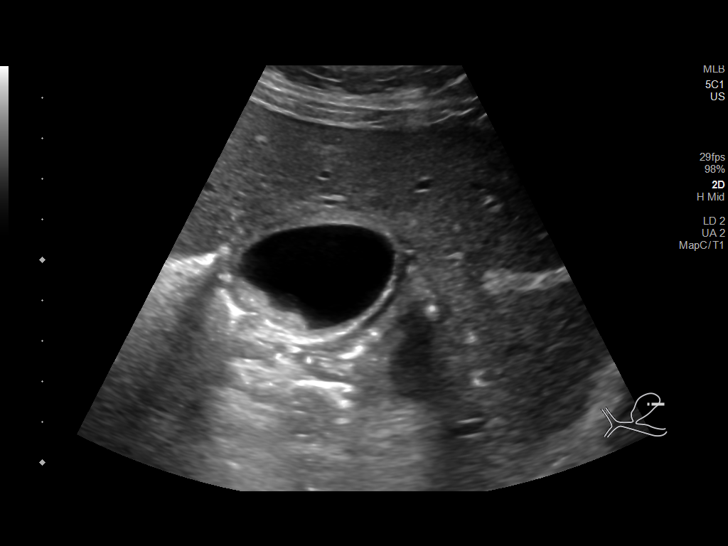
[im 32/59]
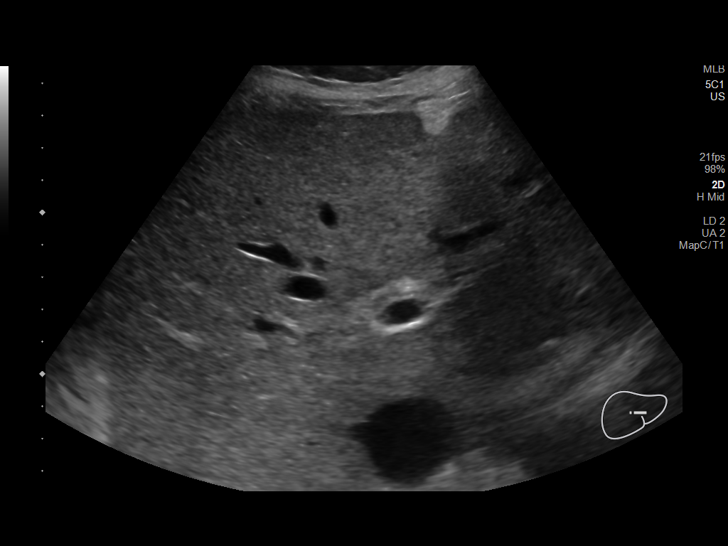
[im 37/59]
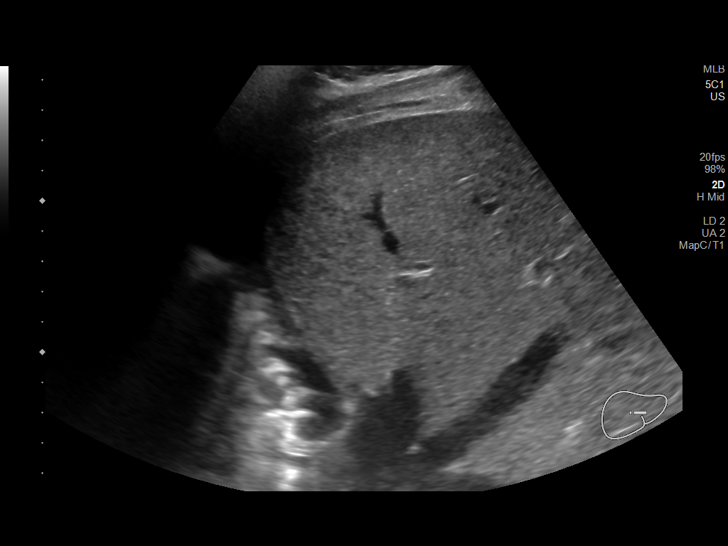
[im 39/59]
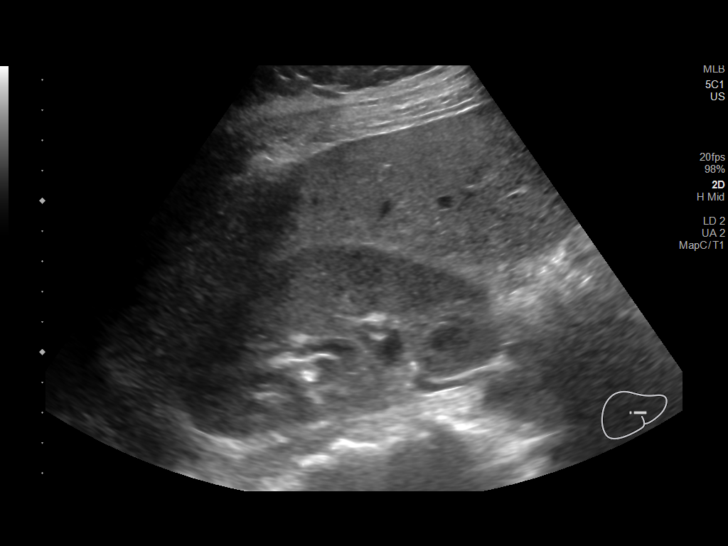
[im 44/59]
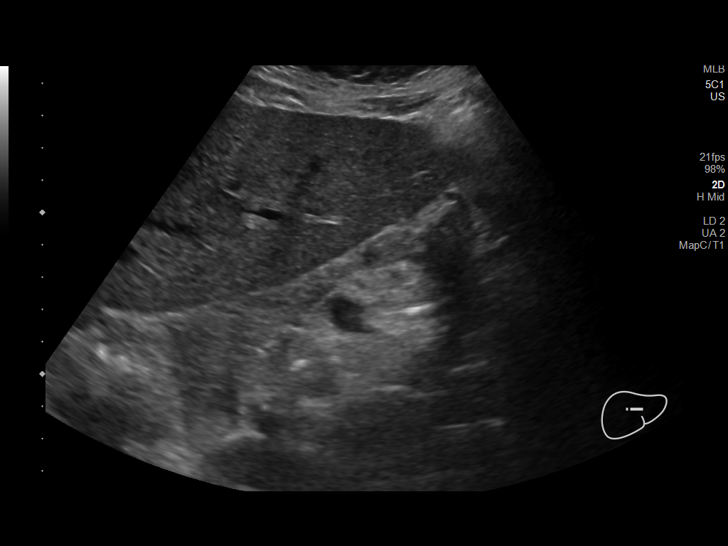
[im 49/59]
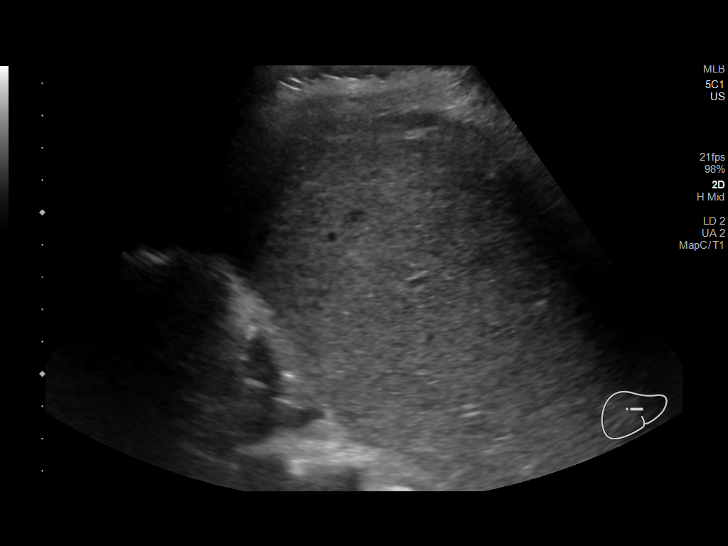
[im 54/59]
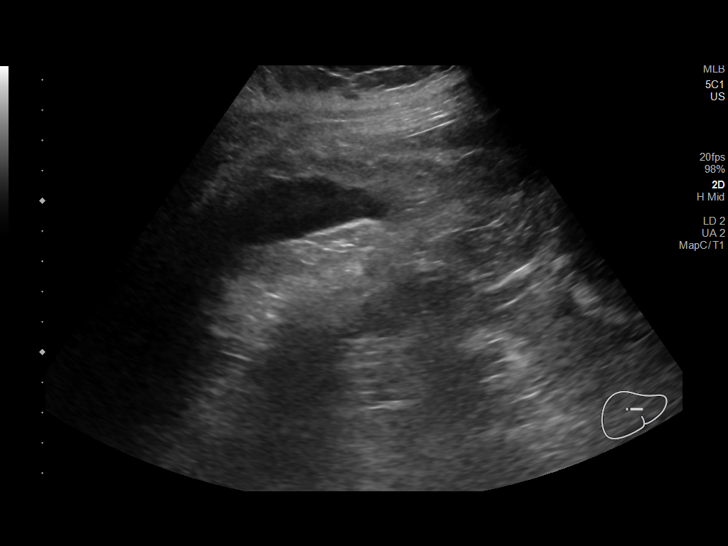
[im 59/59]
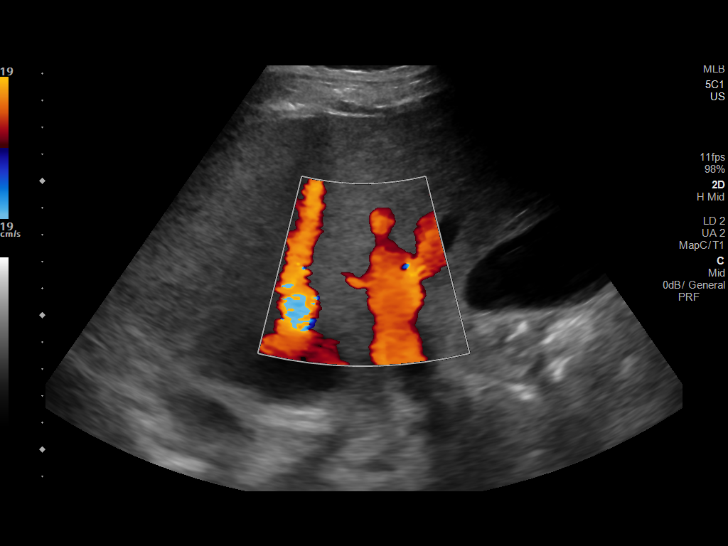

[14 of 25 positions shown; findings below may reference images not displayed]

FINDINGS: Gallbladder:

Cholelithiasis is noted without gallbladder wall thickening or
pericholecystic fluid. No sonographic Murphy's sign is noted. Some
degree of sludge is present as well. 3 mm polyp may be present.

Common bile duct:

Diameter: 3 mm which is within normal limits.

Liver:

No focal lesion identified. Within normal limits in parenchymal
echogenicity. Portal vein is patent on color Doppler imaging with
normal direction of blood flow towards the liver.

Other: Minimal ascites is noted.
IMPRESSION: Cholelithiasis and sludge is noted without definite evidence of
cholecystitis. Minimal ascites.
